# Patient Record
Sex: Female | Born: 1980
Health system: Southern US, Community
[De-identification: ages and names within clinical notes are randomized; demographics above are authoritative.]

## PROBLEM LIST (undated history)

## (undated) DIAGNOSIS — U071 COVID-19: Secondary | ICD-10-CM

## (undated) DIAGNOSIS — N870 Mild cervical dysplasia: Secondary | ICD-10-CM

## (undated) DIAGNOSIS — E119 Type 2 diabetes mellitus without complications: Secondary | ICD-10-CM

## (undated) DIAGNOSIS — R87810 Cervical high risk human papillomavirus (HPV) DNA test positive: Secondary | ICD-10-CM

## (undated) DIAGNOSIS — R5600 Simple febrile convulsions: Secondary | ICD-10-CM

## (undated) DIAGNOSIS — B009 Herpesviral infection, unspecified: Secondary | ICD-10-CM

## (undated) HISTORY — DX: Herpesviral infection, unspecified: B00.9

## (undated) HISTORY — DX: Simple febrile convulsions: R56.00

## (undated) HISTORY — DX: Mild cervical dysplasia: N87.0

## (undated) HISTORY — DX: Type 2 diabetes mellitus without complications: E11.9

## (undated) HISTORY — DX: Cervical high risk human papillomavirus (HPV) DNA test positive: R87.810

---

## 2001-09-08 HISTORY — PX: CHOLECYSTECTOMY: SHX55

## 2018-02-26 ENCOUNTER — Encounter: Payer: 59 | Admitting: Obstetrics and Gynecology

## 2018-06-30 ENCOUNTER — Ambulatory Visit: Payer: Self-pay | Admitting: Physician Assistant

## 2018-07-19 ENCOUNTER — Ambulatory Visit: Payer: 59 | Admitting: Obstetrics and Gynecology

## 2018-07-19 ENCOUNTER — Other Ambulatory Visit: Payer: Self-pay

## 2018-07-19 ENCOUNTER — Encounter: Payer: Self-pay | Admitting: Obstetrics and Gynecology

## 2018-07-19 ENCOUNTER — Other Ambulatory Visit (HOSPITAL_COMMUNITY)
Admission: RE | Admit: 2018-07-19 | Discharge: 2018-07-19 | Disposition: A | Discharge status: Home / Self Care | Payer: 59 | Source: Ambulatory Visit | Attending: Obstetrics and Gynecology | Admitting: Obstetrics and Gynecology

## 2018-07-19 VITALS — BP 130/78 | HR 80 | Resp 16 | Ht 66.5 in | Wt 291.0 lb

## 2018-07-19 DIAGNOSIS — Z113 Encounter for screening for infections with a predominantly sexual mode of transmission: Secondary | ICD-10-CM | POA: Insufficient documentation

## 2018-07-19 DIAGNOSIS — Z01419 Encounter for gynecological examination (general) (routine) without abnormal findings: Secondary | ICD-10-CM | POA: Insufficient documentation

## 2018-07-19 NOTE — Patient Instructions (Signed)
EXERCISE AND DIET:  We recommended that you start or continue a regular exercise program for good health. Regular exercise means any activity that makes your heart beat faster and makes you sweat.  We recommend exercising at least 30 minutes per day at least 3 days a week, preferably 4 or 5.  We also recommend a diet low in fat and sugar.  Inactivity, poor dietary choices and obesity can cause diabetes, heart attack, stroke, and kidney damage, among others.    ALCOHOL AND SMOKING:  Women should limit their alcohol intake to no more than 7 drinks/beers/glasses of wine (combined, not each!) per week. Moderation of alcohol intake to this level decreases your risk of breast cancer and liver damage. And of course, no recreational drugs are part of a healthy lifestyle.  And absolutely no smoking or even second hand smoke. Most people know smoking can cause heart and lung diseases, but did you know it also contributes to weakening of your bones? Aging of your skin?  Yellowing of your teeth and nails?  CALCIUM AND VITAMIN D:  Adequate intake of calcium and Vitamin D are recommended.  The recommendations for exact amounts of these supplements seem to change often, but generally speaking 600 mg of calcium (either carbonate or citrate) and 800 units of Vitamin D per day seems prudent. Certain women may benefit from higher intake of Vitamin D.  If you are among these women, your doctor will have told you during your visit.    PAP SMEARS:  Pap smears, to check for cervical cancer or precancers,  have traditionally been done yearly, although recent scientific advances have shown that most women can have pap smears less often.  However, every woman still should have a physical exam from her gynecologist every year. It will include a breast check, inspection of the vulva and vagina to check for abnormal growths or skin changes, a visual exam of the cervix, and then an exam to evaluate the size and shape of the uterus and  ovaries.  And after 38 years of age, a rectal exam is indicated to check for rectal cancers. We will also provide age appropriate advice regarding health maintenance, like when you should have certain vaccines, screening for sexually transmitted diseases, bone density testing, colonoscopy, mammograms, etc.   MAMMOGRAMS:  All women over 40 years old should have a yearly mammogram. Many facilities now offer a "3D" mammogram, which may cost around $50 extra out of pocket. If possible,  we recommend you accept the option to have the 3D mammogram performed.  It both reduces the number of women who will be called back for extra views which then turn out to be normal, and it is better than the routine mammogram at detecting truly abnormal areas.    COLONOSCOPY:  Colonoscopy to screen for colon cancer is recommended for all women at age 50.  We know, you hate the idea of the prep.  We agree, BUT, having colon cancer and not knowing it is worse!!  Colon cancer so often starts as a polyp that can be seen and removed at colonscopy, which can quite literally save your life!  And if your first colonoscopy is normal and you have no family history of colon cancer, most women don't have to have it again for 10 years.  Once every ten years, you can do something that may end up saving your life, right?  We will be happy to help you get it scheduled when you are ready.    Be sure to check your insurance coverage so you understand how much it will cost.  It may be covered as a preventative service at no cost, but you should check your particular policy.     HPV (Human Papillomavirus) Vaccine: What You Need to Know 1. Why get vaccinated? HPV vaccine prevents infection with human papillomavirus (HPV) types that are associated with many cancers, including:  cervical cancer in females,  vaginal and vulvar cancers in females,  anal cancer in females and males,  throat cancer in females and males, and  penile cancer in  males.  In addition, HPV vaccine prevents infection with HPV types that cause genital warts in both females and males. In the [place] about 12,000 women get cervical cancer every year, and about 4,000 women die from it. HPV vaccine can prevent most of these cases of cervical cancer. Vaccination is not a substitute for cervical cancer screening. This vaccine does not protect against all HPV types that can cause cervical cancer. Women should still get regular Pap tests. HPV infection usually comes from sexual contact, and most people will become infected at some point in their life. About 14 million Americans, including teens, get infected every year. Most infections will go away on their own and not cause serious problems. But thousands of women and men get cancer and other diseases from HPV. 2. HPV vaccine HPV vaccine is approved by FDA and is recommended by CDC for both males and females. It is routinely given at 11 or 38 years of age, but it may be given beginning at age 9 years through age 26 years. Most adolescents 9 through 38 years of age should get HPV vaccine as a two-dose series with the doses separated by 6-12 months. People who start HPV vaccination at 15 years of age and older should get the vaccine as a three-dose series with the second dose given 1-2 months after the first dose and the third dose given 6 months after the first dose. There are several exceptions to these age recommendations. Your health care provider can give you more information. 3. Some people should not get this vaccine  Anyone who has had a severe (life-threatening) allergic reaction to a dose of HPV vaccine should not get another dose.  Anyone who has a severe (life threatening) allergy to any component of HPV vaccine should not get the vaccine.  Tell your doctor if you have any severe allergies that you know of, including a severe allergy to yeast.  HPV vaccine is not recommended for pregnant women. If you learn  that you were pregnant when you were vaccinated, there is no reason to expect any problems for you or your baby. Any woman who learns she was pregnant when she got HPV vaccine is encouraged to contact the manufacturer's registry for HPV vaccination during pregnancy at 1-800-986-8999. Women who are breastfeeding may be vaccinated.  If you have a mild illness, such as a cold, you can probably get the vaccine today. If you are moderately or severely ill, you should probably wait until you recover. Your doctor can advise you. 4. Risks of a vaccine reaction With any medicine, including vaccines, there is a chance of side effects. These are usually mild and go away on their own, but serious reactions are also possible. Most people who get HPV vaccine do not have any serious problems with it. Mild or moderate problems following HPV vaccine:  Reactions in the arm where the shot was given: ? Soreness (about   9 people in 10) ? Redness or swelling (about 1 person in 3)  Fever: ? Mild (100F) (about 1 person in 10) ? Moderate (102F) (about 1 person in 65)  Other problems: ? Headache (about 1 person in 3) Problems that could happen after any injected vaccine:  People sometimes faint after a medical procedure, including vaccination. Sitting or lying down for about 15 minutes can help prevent fainting, and injuries caused by a fall. Tell your doctor if you feel dizzy, or have vision changes or ringing in the ears.  Some people get severe pain in the shoulder and have difficulty moving the arm where a shot was given. This happens very rarely.  Any medication can cause a severe allergic reaction. Such reactions from a vaccine are very rare, estimated at about 1 in a million doses, and would happen within a few minutes to a few hours after the vaccination. As with any medicine, there is a very remote chance of a vaccine causing a serious injury or death. The safety of vaccines is always being monitored. For  more information, visit: www.cdc.gov/vaccinesafety/. 5. What if there is a serious reaction? What should I look for? Look for anything that concerns you, such as signs of a severe allergic reaction, very high fever, or unusual behavior. Signs of a severe allergic reaction can include hives, swelling of the face and throat, difficulty breathing, a fast heartbeat, dizziness, and weakness. These would usually start a few minutes to a few hours after the vaccination. What should I do? If you think it is a severe allergic reaction or other emergency that can't wait, call 9-1-1 or get to the nearest hospital. Otherwise, call your doctor. Afterward, the reaction should be reported to the Vaccine Adverse Event Reporting System (VAERS). Your doctor should file this report, or you can do it yourself through the VAERS web site at www.vaers.hhs.gov, or by calling 1-800-822-7967. VAERS does not give medical advice. 6. The National Vaccine Injury Compensation Program The National Vaccine Injury Compensation Program (VICP) is a federal program that was created to compensate people who may have been injured by certain vaccines. Persons who believe they may have been injured by a vaccine can learn about the program and about filing a claim by calling 1-800-338-2382 or visiting the VICP website at www.hrsa.gov/vaccinecompensation. There is a time limit to file a claim for compensation. 7. How can I learn more?  Ask your health care provider. He or she can give you the vaccine package insert or suggest other sources of information.  Call your local or state health department.  Contact the Centers for Disease Control and Prevention (CDC): ? Call 1-800-232-4636 (1-800-CDC-INFO) or ? Visit CDC's website at www.cdc.gov/hpv Vaccine Information Statement, HPV Vaccine (08/10/2015) This information is not intended to replace advice given to you by your health care provider. Make sure you discuss any questions you have  with your health care provider. Document Released: 03/22/2014 Document Revised: 05/15/2016 Document Reviewed: 05/15/2016 Elsevier Interactive Patient Education  2017 Elsevier Inc.  

## 2018-07-19 NOTE — Progress Notes (Signed)
37 y.o. [id number] Single African American female here for annual exam.    Taking continuous OCPs for her birth control.  Likes her current pill.  Breakthrough bleeding with Ortho Tricyclen. On OCPs since her 89s.   Denies tobacco use, HTN, Migraine with aura, liver or breast disease, DVT/PE or family history of this.   Hx BV.   Moisture with wiping.  No leak unless laughs very hard.  No vaginal discharge.   Bleeding with recent intercourse.  Same partner for 4 months.  This is the first time in a while.  Last occurred with more aggressive intercourse.   Works for [employer].  From [place].   PCP:  None  Patient's last menstrual period was 07/06/2018 (exact date).     Period Cycle (Days): 90 Period Duration (Days): 5 days Period Pattern: Regular Menstrual Flow: Moderate Menstrual Control: Tampon, Maxi pad Menstrual Control Change Freq (Hours): every 3-4 hours on heaviest day Dysmenorrhea: (!) Mild Dysmenorrhea Symptoms: Cramping     Sexually active: Yes.   female The current method of family planning is OCP (estrogen/progesterone).    Exercising: No.   Smoker:  no  Health Maintenance: Pap: 03/2017 normal per patient History of abnormal Pap:  Yes, hx of 2 colposcopies but no treatment to cervix--last colpo 2017 MMG:  n/a Colonoscopy:  n/a BMD:   n/a  Result  n/a TDaP: 2017 Gardasil:   no HIV: during pregnancy Hep C:during pregnancy Screening Labs:   ---  PCP.    reports that she has never smoked. She has never used smokeless tobacco. She reports that she drinks about 1.0 standard drinks of alcohol per week. She reports that she does not use drugs.  Past Medical History:  Diagnosis Date  . Febrile seizures (HCC)    as a child--pt.states epileptic seizures    Past Surgical History:  Procedure Laterality Date  . CHOLECYSTECTOMY  2003    Current Outpatient Medications  Medication Sig Dispense Refill  . norgestrel-ethinyl estradiol (CRYSELLE-28) 0.3-30  MG-MCG tablet Take 1 tablet by mouth daily.    . naproxen (NAPROSYN) 500 MG tablet Take 500 mg by mouth 2 (two) times daily as needed.  0   No current facility-administered medications for this visit.     Family History  Problem Relation Age of Onset  . Diabetes Mother   . Thyroid disease Mother        hypothyroid  . Hypertension Father   . Diabetes Sister   . Diabetes Maternal Grandmother   . Stroke Maternal Grandmother   . Hypertension Paternal Grandmother   . Stroke Paternal Grandmother     Review of Systems  All other systems reviewed and are negative.   Exam:   BP 130/78 (BP Location: Right Arm, Patient Position: Sitting, Cuff Size: Large)   Pulse 80   Resp 16   Ht 5' 6.5" (1.689 m)   Wt 291 lb (132 kg)   LMP 07/06/2018 (Exact Date)   BMI 46.26 kg/m     General appearance: alert, cooperative and appears stated age Head: Normocephalic, without obvious abnormality, atraumatic Neck: no adenopathy, supple, symmetrical, trachea midline and thyroid normal to inspection and palpation Lungs: clear to auscultation bilaterally Breasts: normal appearance, no masses or tenderness, No nipple retraction or dimpling, No nipple discharge or bleeding, No axillary or supraclavicular adenopathy Heart: regular rate and rhythm Abdomen: soft, non-tender; no masses, no organomegaly Extremities: extremities normal, atraumatic, no cyanosis or edema Skin: Skin color, texture, turgor normal. No rashes or  lesions Lymph nodes: Cervical, supraclavicular, and axillary nodes normal. No abnormal inguinal nodes palpated Neurologic: Grossly normal  Pelvic: External genitalia:  no lesions              Urethra:  normal appearing urethra with no masses, tenderness or lesions              Bartholins and Skenes: normal                 Vagina: normal appearing vagina with normal color and discharge, no lesions              Cervix: no lesions              Pap taken: Yes.   Bimanual Exam:  Uterus:   normal size, contour, position, consistency, mobility, non-tender              Adnexa: no mass, fullness, tenderness         Chaperone was present for exam.  Assessment:   Well woman visit with normal exam. Hx abnormal pap.  Post coital bleeding recently.   Plan: Mammogram screening. Recommended self breast awareness. Pap and HR HPV as above. Guidelines for Calcium, Vitamin D, regular exercise program including cardiovascular and weight bearing exercise. We talked about her joining the [facility].  She will contact me for OCP refills when she needs them.  She declines refills today.  Gardasil discussed.  Flu vaccine discussed.  STD screening.  Will get old pap records.  She will establish care with a PCP and do labs there.  Follow up annually and prn.    After visit summary provided.

## 2018-07-20 LAB — HEP, RPR, HIV PANEL
HEP B S AG: NEGATIVE
HIV Screen 4th Generation wRfx: NONREACTIVE
RPR Ser Ql: NONREACTIVE

## 2018-07-20 LAB — HEPATITIS C ANTIBODY

## 2018-07-21 LAB — CYTOLOGY - PAP
CHLAMYDIA, DNA PROBE: NEGATIVE
DIAGNOSIS: NEGATIVE
HPV: NOT DETECTED
Neisseria Gonorrhea: NEGATIVE
TRICH (WINDOWPATH): NEGATIVE

## 2018-11-15 DIAGNOSIS — J0110 Acute frontal sinusitis, unspecified: Secondary | ICD-10-CM | POA: Diagnosis not present

## 2018-12-15 ENCOUNTER — Ambulatory Visit: Payer: Self-pay

## 2018-12-15 ENCOUNTER — Ambulatory Visit (INDEPENDENT_AMBULATORY_CARE_PROVIDER_SITE_OTHER): Payer: BLUE CROSS/BLUE SHIELD | Admitting: Sports Medicine

## 2018-12-15 ENCOUNTER — Other Ambulatory Visit: Payer: Self-pay

## 2018-12-15 ENCOUNTER — Encounter: Payer: Self-pay | Admitting: Sports Medicine

## 2018-12-15 VITALS — BP 120/84 | HR 81 | Temp 98.4°F | Ht 66.5 in | Wt 310.8 lb

## 2018-12-15 DIAGNOSIS — G8929 Other chronic pain: Secondary | ICD-10-CM | POA: Diagnosis not present

## 2018-12-15 DIAGNOSIS — M25561 Pain in right knee: Secondary | ICD-10-CM | POA: Diagnosis not present

## 2018-12-15 DIAGNOSIS — Z6841 Body Mass Index (BMI) 40.0 and over, adult: Secondary | ICD-10-CM

## 2018-12-15 DIAGNOSIS — M25461 Effusion, right knee: Secondary | ICD-10-CM | POA: Diagnosis not present

## 2018-12-15 NOTE — Progress Notes (Signed)
Veverly Fells. [facility] at [facility] [phone]  Kathya Nemer - 38 y.o. female MRN [id number]  Date of birth: April 09, 1981  Visit Date: December 18, 2018  PCP: Patient, No Pcp Per   Referred by: No ref. provider found  SUBJECTIVE:   Chief Complaint  Patient presents with  . New Patient (Initial Visit)    R Knee pain    HPI: Right knee pain since July 2019.  + popping and clicking Seen at [facility] and told X-rays were normal. Injection gave good results however symptoms returned several months ago. Has been working on weight loss but is   REVIEW OF SYSTEMS: Night time disturbances: Reports, pain with laying on her side due to direct pressure on medial knee Fevers, chills and night sweats: Denies Unexplained weight loss: Denies Personal history of cancer: Denies Changes in bowel or bladder habits: Denies Recent unreported falls: Denies New or worsening dyspnea or wheezing: Denies Headaches and dizziness: Denies Numbness, tingling and weakness in the extremities: Denies Dizziness or presyncopal episodes: Denies Lower extremity edema: Denies  HISTORY:  Prior history reviewed and updated per electronic medical record.  There are no active problems to display for this patient.  Social History   Occupational History  . Not on file  Tobacco Use  . Smoking status: Never Smoker  . Smokeless tobacco: Never Used  Substance and Sexual Activity  . Alcohol use: Yes    Alcohol/week: 1.0 standard drinks    Types: 1 Glasses of wine per week    Frequency: Never  . Drug use: Never  . Sexual activity: Yes    Birth control/protection: Pill    Comment: Cryselle   Social History   Social History Narrative  . Not on file   Past Medical History:  Diagnosis Date  . Cervical high risk HPV (human papillomavirus) test positive 2014, 2015   Colposcopy 2016 - negative.  . CIN I (cervical intraepithelial neoplasia I) 2006, 2008, 2010    colposcopic biopsies - [place]  . Febrile seizures (HCC)    as a child--pt.states epileptic seizures   Past Surgical History:  Procedure Laterality Date  . CHOLECYSTECTOMY  2003   family history includes Diabetes in her maternal grandmother, mother, and sister; Hypertension in her father and paternal grandmother; Stroke in her maternal grandmother and paternal grandmother; Thyroid disease in her mother.  OBJECTIVE:  VS:  HT:5' 6.5" (168.9 cm)   WT:(!) 310 lb 12.8 oz (141 kg)  BMI:49.42    BP:120/84  HR:81bpm  TEMP:98.4 F (36.9 C)( )  RESP:98 %   PHYSICAL EXAM: CONSTITUTIONAL: Well-developed, Well-nourished and In no acute distress EYES: Pupils are equal., EOM intact without nystagmus. and No scleral icterus. Psychiatric: Alert & appropriately interactive. and Not depressed or anxious appearing. EXTREMITY EXAM: Warm and well perfused  Right Knee: Large soft tissue envelope Small effusion with moderate synovitis Ligamentously stable Pain with medial and lateral joint line palpation Negative McMurray's Symmetric 1+ LE edema   ASSESSMENT:   1. Chronic pain of right knee     PROCEDURES:  US Guided Injection per procedure note   PROCEDURE NOTE: THERAPEUTIC EXERCISES (97110) 15 minutes spent for Therapeutic exercises as below and as referenced in the AVS.  This included exercises focusing on stretching, strengthening, with significant focus on eccentric aspects.   Proper technique shown and discussed handout in great detail with ATC.  All questions were discussed and answered.   Long term goals include  an improvement in range of motion, strength, endurance as well as avoiding reinjury. Frequency of visits is one time as determined during today's  office visit. Frequency of exercises to be performed is as per handout.  EXERCISES REVIEWED: Hip ABduction strengthening with focus on Glute Medius Recruitment VMO Strengthening      PLAN:  Pertinent additional  documentation may be included in corresponding procedure notes, imaging studies, problem based documentation and patient instructions.  No problem-specific Assessment & Plan notes found for this encounter.   We will go ahead and inject the knee per procedure note and have them begin on hip and knee strenghtening exersises. Additionally we discussed the merits of compression and/or bracing and recommend prophylatic compression with activity.  Icing discussed PRN.  If persistent ongoing symptoms can consider repeat injections and viscous supplementation.  Long discussion today about the merits, struggles and physiology of weight loss.  Ultimately formal consultation with wt loss specialist recommended.  Dr. Philis PiqueWallace's information provided today for PCP  Home Therapeutic Exercises: New program prescribed today per procedure note.   Activity modifications and the importance of avoiding exacerbating activities (limiting pain to no more than a 4 / 10 during or following activity) recommended and discussed.   Discussed red flag symptoms that warrant earlier emergent evaluation and patient voices understanding.    No orders of the defined types were placed in this encounter.  Lab Orders  No laboratory test(s) ordered today   Imaging Orders     US MSK POCT ULTRASOUND Referral Orders  No referral(s) requested today    Return if symptoms worsen or fail to improve.          Andrena MewsMichael D , DO    [facility]

## 2018-12-15 NOTE — Patient Instructions (Addendum)

## 2018-12-18 ENCOUNTER — Encounter: Payer: Self-pay | Admitting: Sports Medicine

## 2018-12-18 DIAGNOSIS — M25561 Pain in right knee: Principal | ICD-10-CM

## 2018-12-18 DIAGNOSIS — G8929 Other chronic pain: Secondary | ICD-10-CM | POA: Insufficient documentation

## 2018-12-18 NOTE — Procedures (Signed)
PROCEDURE NOTE - ULTRASOUND GUIDED INJECTION: LEFT Knee Images were obtained and interpreted by myself, Gaspar Bidding, DO  Images have been saved and stored to PACS system. Images obtained on: GE S7 Ultrasound machine  ULTRASOUND FINDINGS:  Moderate effusion Moderate synovitis No significant spurring Degenerative changes of medial and lateral meniscus  DESCRIPTION OF PROCEDURE:  The patient's clinical condition is marked by substantial pain and/or significant functional disability. Other conservative therapy has not provided relief, is contraindicated, or not appropriate. There is a reasonable likelihood that injection will significantly improve the patient's pain and/or functional impairment. After discussing the risks, benefits and expected outcomes of the injection and all questions were reviewed and answered, the patient wished to undergo the above named procedure. Verbal consent was obtained. The ultrasound was used to identify the target structure and adjacent neurovascular structures. The skin was then prepped in sterile fashion and the target structure was injected under direct visualization using sterile technique as below: PREP: Alcohol, Ethel Chloride APPROACH: Superiolateral, single injection, 25g 1.5" needle INJECTATE: 2cc 1% lidocaine, 2 cc 40mg  DepoMedrol ASPIRATE: N/A DRESSING: Band-Aid   Post procedural instructions including recommending icing and warning signs for infection were reviewed. This procedure was well tolerated and there were no complications.   IMPRESSION: Succesful US Guided Injection

## 2018-12-21 ENCOUNTER — Ambulatory Visit: Payer: BLUE CROSS/BLUE SHIELD | Admitting: Family Medicine

## 2018-12-21 NOTE — Progress Notes (Deleted)
   Virtual Visit via Video   I connected with Lauren Murphy on 12/21/18 at  1:20 PM EDT by a video enabled telemedicine application and verified that I am speaking with the correct person using two identifiers. Location patient: Home Location provider: [facility], Office Persons participating in the virtual visit: Khaniya, Kust, DO   I discussed the limitations of evaluation and management by telemedicine and the availability of in person appointments. The patient expressed understanding and agreed to proceed.  Subjective:   HPI: ***  ROS: See pertinent positives and negatives per HPI.  Patient Active Problem List   Diagnosis Date Noted  . Chronic pain of right knee 12/18/2018    Social History   Tobacco Use  . Smoking status: Never Smoker  . Smokeless tobacco: Never Used  Substance Use Topics  . Alcohol use: Yes    Alcohol/week: 1.0 standard drinks    Types: 1 Glasses of wine per week    Frequency: Never    Current Outpatient Medications:  .  naproxen (NAPROSYN) 500 MG tablet, Take 440 mg by mouth 2 (two) times daily as needed. , Disp: , Rfl: 0 .  norgestrel-ethinyl estradiol (CRYSELLE-28) 0.3-30 MG-MCG tablet, Take 1 tablet by mouth daily., Disp: , Rfl:   No Known Allergies  Objective:   VITALS: Per patient if applicable, see vitals. GENERAL: Alert, appears well and in no acute distress. HEENT: Atraumatic, conjunctiva clear, no obvious abnormalities on inspection of external nose and ears. NECK: Normal movements of the head and neck. CARDIOPULMONARY: No increased WOB. Speaking in clear sentences. I:E ratio WNL.  MS: Moves all visible extremities without noticeable abnormality. PSYCH: Pleasant and cooperative, well-groomed. Speech normal rate and rhythm. Affect is appropriate. Insight and judgement are appropriate. Attention is focused, linear, and appropriate.  NEURO: CN grossly intact. Oriented as arrived to appointment on time with no prompting. Moves  both UE equally.  SKIN: No obvious lesions, wounds, erythema, or cyanosis noted on face or hands.  Assessment and Plan:   There are no diagnoses linked to this encounter.  . Reviewed expectations re: course of current medical issues. . Discussed self-management of symptoms. . Outlined signs and symptoms indicating need for more acute intervention. . Patient verbalized understanding and all questions were answered. Marland Kitchen Health Maintenance issues including appropriate healthy diet, exercise, and smoking avoidance were discussed with patient. . See orders for this visit as documented in the electronic medical record.  Helane Rima, DO 12/21/2018

## 2019-02-06 NOTE — Progress Notes (Signed)
Lauren Murphy is a 38 y.o. female here for a new problem.  History of Present Illness:   Chief Complaint  Patient presents with  . Establish Care  . lump under chin    HPI  Mass under chin Patient reports that she noticed a mass under her chin on Thursday of last week.  She was washing her face and felt a area of fullness under her chin.  She states that it felt like at one point it was a pimple coming up.  Denies any tenderness to the area, hardness, fever or discharge.  She denies any issues swallowing, but does state that she feels like she has some excessive throat clearing at times.  Denies any changes in her voice.  She is a non-smoker.  Mother with hypothyroidism.  Obesity Patient tearfully discussed this today.  She admits that she is struggling with her weight and it affects her on a daily basis.  She is used phentermine in the past.  When she was doing her best, she was down to 180 pounds, but in order to maintain that weight, she felt like she had to work out excessively and eat really well, and she did not feel like this was sustainable for her.  She has considered weight loss surgery, but she feels like this would be considered a failure.  She thinks that she had gestational diabetes with her second child (who is currently 67 y/o.)  She did pass her glucose tolerance test, however her daughter was over 10 pounds when born.  She has a strong family history of diabetes.  Diarrhea Ever since she had her gallbladder out in 2003 she has had significant diarrhea.  She is at the point where she has diarrhea on a daily basis.  She has never seen GI before for this.  She states that she thinks that she has IBS D.  She has to plan her diet accordingly when she is to travel or to be away from the bathroom.  She denies any blood in her diarrhea.  She denies any family history of any GI cancers or Crohn's or colitis.  She also gets abdominal pain.  She has tried probiotics along time ago but did not  find them to be helpful.  Insomnia Over the past 2 to 3 months she has had significant issues with sleeping.  She states that she has had troubles falling asleep and staying asleep.  She denies any caffeine products presently.  She is not on any supplements with stimulants.  She states that she does have a history of insomnia and was even on Ambien at one point.  Past Medical History:  Diagnosis Date  . Cervical high risk HPV (human papillomavirus) test positive 2014, 2015   Colposcopy 2016 - negative.  . CIN I (cervical intraepithelial neoplasia I) 2006, 2008, 2010   colposcopic biopsies - [place]  . Febrile seizures (HCC)    as a child--pt.states epileptic seizures     Social History   Socioeconomic History  . Marital status: Single    Spouse name: Not on file  . Number of children: Not on file  . Years of education: Not on file  . Highest education level: Not on file  Occupational History  . Not on file  Social Needs  . Financial resource strain: Not on file  . Food insecurity:    Worry: Not on file    Inability: Not on file  . Transportation needs:    Medical:  Not on file    Non-medical: Not on file  Tobacco Use  . Smoking status: Never Smoker  . Smokeless tobacco: Never Used  Substance and Sexual Activity  . Alcohol use: Yes    Alcohol/week: 1.0 standard drinks    Types: 1 Glasses of wine per week    Frequency: Never  . Drug use: Never  . Sexual activity: Yes    Birth control/protection: Pill    Comment: Cryselle  Lifestyle  . Physical activity:    Days per week: 1 day    Minutes per session: 30 min  . Stress: Not on file  Relationships  . Social connections:    Talks on phone: Not on file    Gets together: Not on file    Attends religious service: Not on file    Active member of club or organization: Not on file    Attends meetings of clubs or organizations: Not on file    Relationship status: Not on file  . Intimate partner violence:    Fear of  current or ex partner: Not on file    Emotionally abused: Not on file    Physically abused: Not on file    Forced sexual activity: Not on file  Other Topics Concern  . Not on file  Social History Narrative  . Not on file    Past Surgical History:  Procedure Laterality Date  . CHOLECYSTECTOMY  2003    Family History  Problem Relation Age of Onset  . Diabetes Mother   . Thyroid disease Mother        hypothyroid  . Hypertension Father   . Diabetes Sister   . Diabetes Maternal Grandmother   . Stroke Maternal Grandmother   . Hypertension Paternal Grandmother   . Stroke Paternal Grandmother   . Diabetes Paternal Grandfather   . Glaucoma Paternal Grandfather   . Diabetes Sister     No Known Allergies  Current Medications:   Current Outpatient Medications:  .  norgestrel-ethinyl estradiol (CRYSELLE-28) 0.3-30 MG-MCG tablet, Take 1 tablet by mouth daily., Disp: , Rfl:  .  traZODone (DESYREL) 50 MG tablet, Take 0.5-1 tablets (25-50 mg total) by mouth at bedtime as needed for sleep., Disp: 30 tablet, Rfl: 3   Review of Systems:   Review of Systems  Constitutional: Negative for chills, fever, malaise/fatigue and weight loss.  Respiratory: Negative for shortness of breath.   Cardiovascular: Negative for chest pain, orthopnea, claudication and leg swelling.  Gastrointestinal: Positive for abdominal pain and diarrhea. Negative for heartburn, nausea and vomiting.  Neurological: Negative for dizziness, tingling and headaches.  Psychiatric/Behavioral: The patient has insomnia.     Vitals:   Vitals:   02/07/19 0914  BP: 121/61  Pulse: 86  Temp: 98.5 F (36.9 C)  SpO2: 98%  Weight: (!) 310 lb (140.6 kg)  Height: 5\' 6"  (1.676 m)     Body mass index is 50.04 kg/m.  Physical Exam:   Physical Exam Vitals signs and nursing note reviewed.  Constitutional:      General: She is not in acute distress.    Appearance: She is well-developed. She is not ill-appearing or  toxic-appearing.  Cardiovascular:     Rate and Rhythm: Normal rate and regular rhythm.     Pulses: Normal pulses.     Heart sounds: Normal heart sounds, S1 normal and S2 normal.     Comments: No LE edema Pulmonary:     Effort: Pulmonary effort is normal.  Breath sounds: Normal breath sounds.  Skin:    General: Skin is warm and dry.     Comments: Area of localized tissue swelling under chin, no tenderness with palpation, no erythema or discharge present  Neurological:     Mental Status: She is alert.     GCS: GCS eye subscore is 4. GCS verbal subscore is 5. GCS motor subscore is 6.  Psychiatric:        Attention and Perception: Attention normal.        Mood and Affect: Mood normal. Affect is tearful.        Speech: Speech normal.        Behavior: Behavior normal. Behavior is cooperative.      Assessment and Plan:   Sarh was seen today for establish care and lump under chin.  Diagnoses and all orders for this visit:  Neck mass Unclear etiology. Will obtain ultrasound and labs to further work-up. -     CBC with Differential/Platelet -     Comprehensive metabolic panel -     TSH -     US Soft Tissue Head/Neck; Future  Diarrhea, unspecified type Referral to GI.  I did encourage her to try some fiber to help bulk up her stools, and consider resuming probiotics. -     Ambulatory referral to Gastroenterology  Obesity, unspecified classification, unspecified obesity type, unspecified whether serious comorbidity present I am going to have her see Dr. Helane Rima for further evaluation and treatment.  Insomnia, unspecified type Start trazodone, and follow-up if no improvement.  Other orders -     traZODone (DESYREL) 50 MG tablet; Take 0.5-1 tablets (25-50 mg total) by mouth at bedtime as needed for sleep.   . Reviewed expectations re: course of current medical issues. . Discussed self-management of symptoms. . Outlined signs and symptoms indicating need for more acute  intervention. . Patient verbalized understanding and all questions were answered. . See orders for this visit as documented in the electronic medical record. . Patient received an After-Visit Summary.    Jarold Motto, PA-C

## 2019-02-07 ENCOUNTER — Other Ambulatory Visit: Payer: Self-pay

## 2019-02-07 ENCOUNTER — Encounter: Payer: Self-pay | Admitting: Physician Assistant

## 2019-02-07 ENCOUNTER — Ambulatory Visit (INDEPENDENT_AMBULATORY_CARE_PROVIDER_SITE_OTHER): Payer: BLUE CROSS/BLUE SHIELD | Admitting: Physician Assistant

## 2019-02-07 VITALS — BP 121/61 | HR 86 | Temp 98.5°F | Ht 66.0 in | Wt 310.0 lb

## 2019-02-07 DIAGNOSIS — E669 Obesity, unspecified: Secondary | ICD-10-CM | POA: Diagnosis not present

## 2019-02-07 DIAGNOSIS — R7309 Other abnormal glucose: Secondary | ICD-10-CM

## 2019-02-07 DIAGNOSIS — R221 Localized swelling, mass and lump, neck: Secondary | ICD-10-CM | POA: Diagnosis not present

## 2019-02-07 DIAGNOSIS — G4700 Insomnia, unspecified: Secondary | ICD-10-CM | POA: Diagnosis not present

## 2019-02-07 DIAGNOSIS — R197 Diarrhea, unspecified: Secondary | ICD-10-CM

## 2019-02-07 LAB — COMPREHENSIVE METABOLIC PANEL
ALT: 17 U/L (ref 0–35)
AST: 18 U/L (ref 0–37)
Albumin: 3.7 g/dL (ref 3.5–5.2)
Alkaline Phosphatase: 40 U/L (ref 39–117)
BUN: 11 mg/dL (ref 6–23)
CO2: 26 mEq/L (ref 19–32)
Calcium: 8.9 mg/dL (ref 8.4–10.5)
Chloride: 102 mEq/L (ref 96–112)
Creatinine, Ser: 0.69 mg/dL (ref 0.40–1.20)
GFR: 114.58 mL/min (ref >60.00)
Glucose, Bld: 121 mg/dL — ABNORMAL HIGH (ref 70–99)
Potassium: 3.6 mEq/L (ref 3.5–5.1)
Sodium: 137 mEq/L (ref 135–145)
Total Bilirubin: 0.3 mg/dL (ref 0.2–1.2)
Total Protein: 7.2 g/dL (ref 6.0–8.3)

## 2019-02-07 LAB — CBC WITH DIFFERENTIAL/PLATELET
Basophils Absolute: 0.1 10*3/uL (ref 0.0–0.1)
Basophils Relative: 1 % (ref 0.0–3.0)
Eosinophils Absolute: 0.3 10*3/uL (ref 0.0–0.7)
Eosinophils Relative: 4.1 % (ref 0.0–5.0)
HCT: 37.5 % (ref 36.0–46.0)
Hemoglobin: 12.6 g/dL (ref 12.0–15.0)
Lymphocytes Relative: 31.7 % (ref 12.0–46.0)
Lymphs Abs: 2.4 10*3/uL (ref 0.7–4.0)
MCHC: 33.5 g/dL (ref 30.0–36.0)
MCV: 90.9 fl (ref 78.0–100.0)
Monocytes Absolute: 0.3 10*3/uL (ref 0.1–1.0)
Monocytes Relative: 4 % (ref 3.0–12.0)
Neutro Abs: 4.4 10*3/uL (ref 1.4–7.7)
Neutrophils Relative %: 59.2 % (ref 43.0–77.0)
Platelets: 269 10*3/uL (ref 150.0–400.0)
RBC: 4.12 Mil/uL (ref 3.87–5.11)
RDW: 13.3 % (ref 11.5–15.5)
WBC: 7.5 10*3/uL (ref 4.0–10.5)

## 2019-02-07 LAB — TSH: TSH: 1.76 u[IU]/mL (ref 0.35–4.50)

## 2019-02-07 MED ORDER — TRAZODONE HCL 50 MG PO TABS: 25.0000 mg | ORAL_TABLET | Freq: Every evening | ORAL | 3 refills | Status: DC | PRN

## 2019-02-07 NOTE — Patient Instructions (Signed)
It was great to see you! Welcome to the [facility] team :)  You will be contacted about scheduling your ultrasound and your referral to GI.  We will contact you with your lab results.  Start the 25 mg Trazodone and see if this helps with sleep. You may take 50 mg if you find it ineffective.  Please follow-up with Dr. Earlene Plater about weight loss.   Take care,  Jarold Motto PA-C

## 2019-02-08 LAB — HEMOGLOBIN A1C: Hgb A1c MFr Bld: 7.6 % — ABNORMAL HIGH (ref 4.6–6.5)

## 2019-02-08 NOTE — Addendum Note (Signed)
Addended by: Haynes Bast on: 02/08/2019 08:38 AM   Modules accepted: Orders

## 2019-02-08 NOTE — Addendum Note (Signed)
Addended by: Charna Elizabeth on: 02/08/2019 02:02 PM   Modules accepted: Orders

## 2019-02-09 ENCOUNTER — Encounter: Payer: Self-pay | Admitting: Physician Assistant

## 2019-02-09 DIAGNOSIS — E119 Type 2 diabetes mellitus without complications: Secondary | ICD-10-CM | POA: Insufficient documentation

## 2019-02-16 ENCOUNTER — Ambulatory Visit: Payer: BLUE CROSS/BLUE SHIELD | Admitting: Family Medicine

## 2019-02-18 ENCOUNTER — Ambulatory Visit: Payer: BLUE CROSS/BLUE SHIELD | Admitting: Gastroenterology

## 2019-02-23 ENCOUNTER — Ambulatory Visit (INDEPENDENT_AMBULATORY_CARE_PROVIDER_SITE_OTHER): Payer: BC Managed Care – PPO | Admitting: Family Medicine

## 2019-02-23 ENCOUNTER — Other Ambulatory Visit: Payer: Self-pay

## 2019-02-23 ENCOUNTER — Encounter: Payer: Self-pay | Admitting: Family Medicine

## 2019-02-23 VITALS — BP 126/82 | HR 91 | Temp 99.1°F | Ht 66.75 in | Wt 305.8 lb

## 2019-02-23 DIAGNOSIS — Z6841 Body Mass Index (BMI) 40.0 and over, adult: Secondary | ICD-10-CM

## 2019-02-23 DIAGNOSIS — E119 Type 2 diabetes mellitus without complications: Secondary | ICD-10-CM | POA: Diagnosis not present

## 2019-02-23 MED ORDER — ONDANSETRON HCL 4 MG PO TABS: 4.0000 mg | ORAL_TABLET | Freq: Three times a day (TID) | ORAL | 0 refills | Status: DC | PRN

## 2019-02-23 NOTE — Progress Notes (Signed)
Lauren Murphy is a 38 y.o. female is here for follow up of obesity management.   Subjective   History of Present Illness: Recent new patient appointment with Lauren MottoSamantha Murphy. A1c was 7.6 and Trulicity was recommended. She has not started this yet, worried that it will cause GI upset in setting of her IBS. Working on weight loss. Down 5 pounds. Struggling with night-time cravings.  Weight Change: Wt Readings from Last 3 Encounters:  03/01/19 (!) 305 lb (138.3 kg)  02/23/19 (!) 305 lb 12.8 oz (138.7 kg)  02/07/19 (!) 310 lb (140.6 kg)   Appetite:  Medication(s): Phentermine taken about 9 years ago. It did help a lot. She restarted it about three years ago did not help much.    Appetite well controlled? No. How hungry? (not hungry) 1-10 (starving) Answer: 4   Sleep Quality:  She reports sleeping fairly well and is getting 6 hours of sleep a night.   Stress:  The patient indicates a stress level of 6 (1: low stress, 10: high stress) caused by Other: family stress, COVID, not feeling strong enough to lose weight without help.  No flowsheet data found.   [facility] 02/07/2019  Decreased Interest 0  Down, Depressed, Hopeless 0  PHQ - 2 Score 0  Altered sleeping 3  Tired, decreased energy 0  Change in appetite 2  Feeling bad or failure about yourself  0  Trouble concentrating 0  Moving slowly or fidgety/restless 0  Suicidal thoughts 0  PHQ-9 Score 5  Difficult doing work/chores Somewhat difficult   Barriers: Lack of confidence  PMHx, SurgHx, SocialHx, FamHx, Medications, and Allergies were reviewed in the Visit Navigator and updated as appropriate.   Patient Active Problem List   Diagnosis Date Noted  . Diabetes mellitus without complication (HCC) 02/09/2019  . Chronic pain of right knee 12/18/2018   Social History   Tobacco Use  . Smoking status: Never Smoker  . Smokeless tobacco: Never Used  Substance Use Topics  . Alcohol use: Yes    Alcohol/week: 1.0  standard drinks    Types: 1 Glasses of wine per week    Frequency: Never  . Drug use: Never   Current Medications and Allergies:   Current Outpatient Medications:  .  norgestrel-ethinyl estradiol (CRYSELLE-28) 0.3-30 MG-MCG tablet, Take 1 tablet by mouth daily., Disp: , Rfl:  .  traZODone (DESYREL) 50 MG tablet, Take 0.5-1 tablets (25-50 mg total) by mouth at bedtime as needed for sleep., Disp: 30 tablet, Rfl: 3 .  AMBULATORY NON FORMULARY MEDICATION, Medication Name: [employer] supplement, Disp: , Rfl:  .  ondansetron (ZOFRAN) 4 MG tablet, Take 1 tablet (4 mg total) by mouth every 8 (eight) hours as needed for nausea or vomiting., Disp: 20 tablet, Rfl: 0  No Known Allergies   Review of Systems   Pertinent items are noted in the HPI. Otherwise, ROS is negative.  Vitals:   Vitals:   02/23/19 1154  BP: 126/82  Pulse: 91  Temp: 99.1 F (37.3 C)  TempSrc: Oral  SpO2: 96%  Weight: (!) 305 lb 12.8 oz (138.7 kg)  Height: 5' 6.75" (1.695 m)     Body mass index is 48.25 kg/m.  Physical Exam:   Physical Exam Vitals signs and nursing note reviewed.  HENT:     Head: Normocephalic and atraumatic.  Eyes:     Pupils: Pupils are equal, round, and reactive to light.  Neck:     Musculoskeletal: Normal range of motion  and neck supple.  Cardiovascular:     Rate and Rhythm: Normal rate and regular rhythm.     Heart sounds: Normal heart sounds.  Pulmonary:     Effort: Pulmonary effort is normal.  Abdominal:     Palpations: Abdomen is soft.  Skin:    General: Skin is warm.  Psychiatric:        Behavior: Behavior normal.    CBC Latest Ref Rng & Units 02/07/2019  WBC 4.0 - 10.5 K/uL 7.5  Hemoglobin 12.0 - 15.0 g/dL 12.6  Hematocrit 36.0 - 46.0 % 37.5  Platelets 150.0 - 400.0 K/uL 269.0   Lab Results  Component Value Date   ALT 17 02/07/2019   AST 18 02/07/2019   ALKPHOS 40 02/07/2019   BILITOT 0.3 02/07/2019   Lab Results  Component Value Date   CREATININE 0.69 02/07/2019     BUN 11 02/07/2019   NA 137 02/07/2019   K 3.6 02/07/2019   CL 102 02/07/2019   CO2 26 02/07/2019   Lab Results  Component Value Date   HGBA1C 7.6 (H) 02/08/2019   No results found for: INSULIN  Lab Results  Component Value Date   TSH 1.76 02/07/2019   No results found for: CHOL, HDL, LDLCALC, LDLDIRECT, TRIG, CHOLHDL No results found for: IRON, TIBC, FERRITIN  Assessment and Plan:   Lauren Murphy was seen today for follow-up.  Diagnoses and all orders for this visit:  Diabetes mellitus without complication ([name]) Comments: Discussed medication and benefits of weight loss at length. Will start medication. Follow up with PCP in 2-4 weeks. Orders: -     ondansetron (ZOFRAN) 4 MG tablet; Take 1 tablet (4 mg total) by mouth every 8 (eight) hours as needed for nausea or vomiting.  Class 3 severe obesity due to excess calories without serious comorbidity with body mass index (BMI) of 45.0 to 49.9 in adult [facility])   FITTE:   [x]   Cardio [x]   Resistance exercises  []   ACSM recommendations (150 minutes/week in active weight loss)  Behavior:   [x]   Motivational interviewing performed []   Referral for counseling [x]   Discussed strategies to overcome habits/challenges for focus  Medications:  See orders as documented in the EMR.  Education:  [x]  Lifestyle/Behavioral Modification  [x]  Balance and Variety  []  Appropriate Portions  []  Label Reading  []  Caloric Needs [x]  Physical Activity   [x]  Goal Setting  []  Dining Out  Reviewed: [x]   Nutrition and the importance of regular protein intake. [x]   Hidden CHO/carbohydrate sources. [x]   Alcohol as possible source of hidden/amnesia calories and its effects. [x]   Importance of physical activity and reducing sedentary time. [x]   Reviewed expectations re: course of current medical issues. [x]   Outlined signs and symptoms indicating need for more acute intervention. [x]   See orders for this visit as documented in the electronic medical  record.  Briscoe Deutscher, DO [place], [facility] 03/01/2019  Records requested if needed. Time spent: 25 minutes, of which >50% was spent in obtaining information about her symptoms, reviewing her previous labs, evaluations, and treatments, counseling her about her condition (please see the discussed topics above), and developing a plan to further investigate it; she had a number of questions which I addressed.

## 2019-02-23 NOTE — Patient Instructions (Signed)
Health Maintenance Due  Topic Date Due  . PNEUMOCOCCAL POLYSACCHARIDE VACCINE AGE 39-64 HIGH RISK  01/18/1982  . FOOT EXAM  01/18/1990  . OPHTHALMOLOGY EXAM  01/18/1990  . URINE MICROALBUMIN  01/18/1990    Depression screen PHQ 2/9 02/07/2019  Decreased Interest 0  Down, Depressed, Hopeless 0  PHQ - 2 Score 0  Altered sleeping 3  Tired, decreased energy 0  Change in appetite 2  Feeling bad or failure about yourself  0  Trouble concentrating 0  Moving slowly or fidgety/restless 0  Suicidal thoughts 0  PHQ-9 Score 5  Difficult doing work/chores Somewhat difficult

## 2019-03-01 ENCOUNTER — Ambulatory Visit (INDEPENDENT_AMBULATORY_CARE_PROVIDER_SITE_OTHER): Payer: BC Managed Care – PPO | Admitting: Gastroenterology

## 2019-03-01 ENCOUNTER — Encounter: Payer: Self-pay | Admitting: Gastroenterology

## 2019-03-01 VITALS — Ht 67.0 in | Wt 305.0 lb

## 2019-03-01 DIAGNOSIS — R140 Abdominal distension (gaseous): Secondary | ICD-10-CM

## 2019-03-01 DIAGNOSIS — K529 Noninfective gastroenteritis and colitis, unspecified: Secondary | ICD-10-CM | POA: Diagnosis not present

## 2019-03-01 MED ORDER — COLESEVELAM HCL 625 MG PO TABS: 1250.0000 mg | ORAL_TABLET | Freq: Every day | ORAL | 1 refills | Status: DC

## 2019-03-01 NOTE — Addendum Note (Signed)
Addended by: Nelida Meuse on: 03/01/2019 04:37 PM   Modules accepted: Orders

## 2019-03-01 NOTE — Progress Notes (Signed)
This patient contacted our office requesting a physician telemedicine consultation regarding clinical questions and/or test results.  If new patient, they were referred by PCp noted below  Participants on the conference : myself and patient   The patient consented to this consultation and was aware that a charge will be placed through their insurance.  I was in my office and the patient was at home   Encounter time:  Total time 35 minutes, with 30 minutes spent with patient on Doximity   _____________________________________________________________________________________________              [facility]:  History: Lauren FredericksonDana Murphy 03/01/2019  Referring provider: Jarold MottoWorley, Samantha, PA  Reason for consult/chief complaint: chronic diarrhea  Subjective  HPI: June 1st primary care clinic Murphy reviewed - outlining struggles with obesity, insomnia, and chronic diarrhea since cholecystectomy in 2003.  Lauren HarmanDana describes having 2-3 loose stools per day with urgency for about the last 15 years, ever since cholecystectomy.  She has bloating sometimes crampy pain before BM relieved afterwards.  Denies rectal bleeding.  No prior GI evaluation for this diarrhea. Rare nocturnal diarrhea, typically only if she has a high-fat meal in the evening.  She denies chronic skin rash, hives, painful eye redness joint swelling or chronic back pain.  It also seems she has not been prescribed any medicines for this in the past. Symptoms are definitely worse with stress, especially at work.  ROS:  Review of Systems  Constitutional: Negative for appetite change and unexpected weight change.  HENT: Negative for mouth sores and voice change.   Eyes: Negative for pain and redness.  Respiratory: Negative for cough and shortness of breath.   Cardiovascular: Negative for chest pain and palpitations.  Genitourinary: Negative for dysuria and hematuria.  Musculoskeletal: Negative for  arthralgias and myalgias.  Skin: Negative for pallor and rash.  Neurological: Negative for weakness and headaches.  Hematological: Negative for adenopathy.     Past Medical History: Past Medical History:  Diagnosis Date  . Cervical high risk HPV (human papillomavirus) test positive 2014, 2015   Colposcopy 2016 - negative.  . CIN I (cervical intraepithelial neoplasia I) 2006, 2008, 2010   [place]  . Febrile seizures ([facility])    as a child--pt.states epileptic seizures     Past Surgical History: Past Surgical History:  Procedure Laterality Date  . CHOLECYSTECTOMY  2003     Family History: Family History  Problem Relation Age of Onset  . Diabetes Mother   . Thyroid disease Mother        hypothyroid  . Hypertension Father   . Diabetes Sister   . Diabetes Maternal Grandmother   . Stroke Maternal Grandmother   . Hypertension Paternal Grandmother   . Stroke Paternal Grandmother   . Diabetes Paternal Grandfather   . Glaucoma Paternal Grandfather   . Diabetes Sister   . Colon cancer Neg Hx   . Stomach cancer Neg Hx   . Pancreatic cancer Neg Hx   . Pancreatic disease Neg Hx     Social History: Social History   Socioeconomic History  . Marital status: Single    Spouse name: Not on file  . Number of children: Not on file  . Years of education: Not on file  . Highest education level: Not on file  Occupational History  . Not on file  Social Needs  . Financial resource strain: Not on file  . Food insecurity    Worry: Not on file  Inability: Not on file  . Transportation needs    Medical: Not on file    Non-medical: Not on file  Tobacco Use  . Smoking status: Never Smoker  . Smokeless tobacco: Never Used  Substance and Sexual Activity  . Alcohol use: Yes    Alcohol/week: 1.0 standard drinks    Types: 1 Glasses of wine per week    Frequency: Never  . Drug use: Never  . Sexual activity: Yes    Birth control/protection: Pill     Comment: [place]  Lifestyle  . Physical activity    Days per week: 1 day    Minutes per session: 30 min  . Stress: Not on file  Relationships  . Social Herbalist on phone: Not on file    Gets together: Not on file    Attends religious service: Not on file    Active member of club or organization: Not on file    Attends meetings of clubs or organizations: Not on file    Relationship status: Not on file  Other Topics Concern  . Not on file  Social History Narrative  . Not on file    Allergies: No Known Allergies  Outpatient Meds: Current Outpatient Medications  Medication Sig Dispense Refill  . AMBULATORY NON FORMULARY MEDICATION Medication Name: [employer] supplement    . norgestrel-ethinyl estradiol (CRYSELLE-28) 0.3-30 MG-MCG tablet Take 1 tablet by mouth daily.    . ondansetron (ZOFRAN) 4 MG tablet Take 1 tablet (4 mg total) by mouth every 8 (eight) hours as needed for nausea or vomiting. 20 tablet 0  . traZODone (DESYREL) 50 MG tablet Take 0.5-1 tablets (25-50 mg total) by mouth at bedtime as needed for sleep. 30 tablet 3   No current facility-administered medications for this visit.     Takes her OCP about 10 PM, her usual bedtime.  ___________________________________________________________________ Objective   Exam:  Ht 5\' 7"  (1.702 m)   Wt (!) 305 lb (138.3 kg)   BMI 47.77 kg/m  During June 1st primary care office Murphy, weight 310 pounds with BMI over 48   No exam - virtual visit She is well-appearing, pleasant and conversational  Labs:  CBC Latest Ref Rng & Units 02/07/2019  WBC 4.0 - 10.5 K/uL 7.5  Hemoglobin 12.0 - 15.0 g/dL 12.6  Hematocrit 36.0 - 46.0 % 37.5  Platelets 150.0 - 400.0 K/uL 269.0   CMP Latest Ref Rng & Units 02/07/2019  Glucose 70 - 99 mg/dL 121(H)  BUN 6 - 23 mg/dL 11  Creatinine 0.40 - 1.20 mg/dL 0.69  Sodium 135 - 145 mEq/L 137  Potassium 3.5 - 5.1 mEq/L 3.6  Chloride 96 - 112 mEq/L 102  CO2 19 - 32 mEq/L 26  Calcium  8.4 - 10.5 mg/dL 8.9  Total Protein 6.0 - 8.3 g/dL 7.2  Total Bilirubin 0.2 - 1.2 mg/dL 0.3  Alkaline Phos 39 - 117 U/L 40  AST 0 - 37 U/L 18  ALT 0 - 35 U/L 17   Lab Results  Component Value Date   TSH 1.76 02/07/2019    No abdominal imaging for review  Assessment: Encounter Diagnoses  Name Primary?  . Chronic diarrhea Yes  . Abdominal bloating     Many elements consistent with IBS, also bile acid diarrhea, given that it began after cholecystectomy.  Discussed these possibilities and available therapies.  I have elected to start WelChol 625 mg, 2 tablets every morning. If no improvement after a week, it  can be increased to 2 tablets twice a day with breakfast and supper.  I explained how this medicine can potentially affect absorption of other medicines and decrease their efficacy, including oral contraceptives.  Therefore, it is essential that she take this medicine at least 2 hours before any other medicines.  Given how she currently takes her OCP, she feels that is manageable.  If that medicine does not help sufficiently, or is limited by side effects, antispasmodic therapy will be tried.  Plan: Low-fat diet recommended.  She was unable to tolerate the keto diet recently because of the fat content.  Prescription was sent to her pharmacy to try welchol  My office will contact her to set up a follow-up telemedicine visit with me in 3 to 4 weeks.  She is encouraged to call sooner as needed.  She will eventually need in person evaluation with me when COVID restrictions allow, as she is a new patient to this practice.  Thank you for the courtesy of this consult.  Please call me with any questions or concerns.  Charlie PitterHenry L Danis III  CC: Referring provider noted above

## 2019-03-02 ENCOUNTER — Encounter: Payer: Self-pay | Admitting: Physician Assistant

## 2019-03-02 NOTE — Telephone Encounter (Signed)
Called pt and schedule appt for Monday with [facility].

## 2019-03-07 ENCOUNTER — Encounter: Payer: Self-pay | Admitting: Physician Assistant

## 2019-03-07 ENCOUNTER — Ambulatory Visit (INDEPENDENT_AMBULATORY_CARE_PROVIDER_SITE_OTHER): Payer: BC Managed Care – PPO | Admitting: Physician Assistant

## 2019-03-07 DIAGNOSIS — G4700 Insomnia, unspecified: Secondary | ICD-10-CM | POA: Diagnosis not present

## 2019-03-07 MED ORDER — HYDROXYZINE HCL 25 MG PO TABS: 25.0000 mg | ORAL_TABLET | Freq: Every evening | ORAL | 0 refills | Status: DC | PRN

## 2019-03-07 NOTE — Progress Notes (Signed)
Virtual Visit via Video   I connected with Aune Adami on 03/07/19 at  3:40 PM EDT by a video enabled telemedicine application and verified that I am speaking with the correct person using two identifiers. Location patient: Home Location provider: [facility], Office Persons participating in the virtual visit: Yeira, Gulden PA-C.   I discussed the limitations of evaluation and management by telemedicine and the availability of in person appointments. The patient expressed understanding and agreed to proceed.   Subjective:   HPI:   Insomnia Last saw me for this on 02/07/19. She was started on 50 mg trazodone and has not had any benefit to her helping her stay asleep -- she continues to wake up around 3-4 am. Denies any caffeine products or use of stimulants. Has anxious thoughts but doesn't think that this is causing her insomnia presently.  ROS: See pertinent positives and negatives per HPI.  Patient Active Problem List   Diagnosis Date Noted  . Diabetes mellitus without complication ([place]) 25/85/2778  . Chronic pain of right knee 12/18/2018    Social History   Tobacco Use  . Smoking status: Never Smoker  . Smokeless tobacco: Never Used  Substance Use Topics  . Alcohol use: Yes    Alcohol/week: 1.0 standard drinks    Types: 1 Glasses of wine per week    Frequency: Never    Current Outpatient Medications:  .  AMBULATORY NON FORMULARY MEDICATION, Medication Name: [employer] supplement, Disp: , Rfl:  .  colesevelam (WELCHOL) 625 MG tablet, Take 2 tablets (1,250 mg total) by mouth daily with breakfast., Disp: 60 tablet, Rfl: 1 .  hydrOXYzine (ATARAX/VISTARIL) 25 MG tablet, Take 1 tablet (25 mg total) by mouth at bedtime as needed for anxiety (insomnia). Take one 25 mg tablet 30-60 minutes prior to bedtime for insomnia, anxiety. May increase to two tablets., Disp: 60 tablet, Rfl: 0 .  norgestrel-ethinyl estradiol (CRYSELLE-28) 0.3-30 MG-MCG tablet, Take 1 tablet by  mouth daily., Disp: , Rfl:  .  ondansetron (ZOFRAN) 4 MG tablet, Take 1 tablet (4 mg total) by mouth every 8 (eight) hours as needed for nausea or vomiting., Disp: 20 tablet, Rfl: 0  No Known Allergies  Objective:   VITALS: Per patient if applicable, see vitals. GENERAL: Alert, appears well and in no acute distress. HEENT: Atraumatic, conjunctiva clear, no obvious abnormalities on inspection of external nose and ears. NECK: Normal movements of the head and neck. CARDIOPULMONARY: No increased WOB. Speaking in clear sentences. I:E ratio WNL.  MS: Moves all visible extremities without noticeable abnormality. PSYCH: Pleasant and cooperative, well-groomed. Speech normal rate and rhythm. Affect is appropriate. Insight and judgement are appropriate. Attention is focused, linear, and appropriate.  NEURO: CN grossly intact. Oriented as arrived to appointment on time with no prompting. Moves both UE equally.  SKIN: No obvious lesions, wounds, erythema, or cyanosis noted on face or hands.  Assessment and Plan:   Genell was seen today for insomnia.  Diagnoses and all orders for this visit:  Insomnia, unspecified type Uncontrolled. Stop Trazodone per patient request. Will trial Atarax. Follow-up if no improvement or worsening.  Other orders -     hydrOXYzine (ATARAX/VISTARIL) 25 MG tablet; Take 1 tablet (25 mg total) by mouth at bedtime as needed for anxiety (insomnia). Take one 25 mg tablet 30-60 minutes prior to bedtime for insomnia, anxiety. May increase to two tablets.    . Reviewed expectations re: course of current medical issues. . Discussed self-management of symptoms. Marland Kitchen  Outlined signs and symptoms indicating need for more acute intervention. . Patient verbalized understanding and all questions were answered. Marland Kitchen. Health Maintenance issues including appropriate healthy diet, exercise, and smoking avoidance were discussed with patient. . See orders for this visit as documented in the  electronic medical record.  I discussed the assessment and treatment plan with the patient. The patient was provided an opportunity to ask questions and all were answered. The patient agreed with the plan and demonstrated an understanding of the instructions.   The patient was advised to call back or seek an in-person evaluation if the symptoms worsen or if the condition fails to improve as anticipated.   CMA or LPN served as scribe during this visit. History, Physical, and Plan performed by medical provider. The above documentation has been reviewed and is accurate and complete.   LakemoreSamantha Srihan Brutus, GeorgiaPA 03/07/2019

## 2019-03-30 ENCOUNTER — Ambulatory Visit: Payer: BC Managed Care – PPO | Admitting: Gastroenterology

## 2019-04-06 DIAGNOSIS — H6123 Impacted cerumen, bilateral: Secondary | ICD-10-CM | POA: Diagnosis not present

## 2019-04-06 DIAGNOSIS — H608X3 Other otitis externa, bilateral: Secondary | ICD-10-CM | POA: Diagnosis not present

## 2019-05-20 DIAGNOSIS — J0110 Acute frontal sinusitis, unspecified: Secondary | ICD-10-CM | POA: Diagnosis not present

## 2019-05-27 ENCOUNTER — Ambulatory Visit: Payer: Self-pay

## 2019-05-27 NOTE — Telephone Encounter (Signed)
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 Lauren Murphy.  Lauren Murphy states that she does not have any Sx. Patent last seen Pt. Wednesday.  Was with the Lauren Murphy who has the virus no that an hour on 05/25/19. DEnies any Sx.  Provided care advice.  Lauren Murphy wants to know if she should take a test.  Explained to Lauren Murphy the protocol. Would like to discuss with Park LiterSamantha Worly, PA      Reason for Disposition . COVID-19 Home Isolation, questions about  Answer Assessment - Initial Assessment Questions 1. CLOSE CONTACT: "Who is the person with the confirmed or suspected COVID-19 infection that you were exposed to?"      Pt she work s for has been tested positive 2. PLACE of CONTACT: "Where were you when you were exposed to COVID-19?" (e.g., home, school, medical waiting room; which city?)     At Patients home.    3. TYPE of CONTACT: "How much contact was there?" (e.g., sitting next to, live in same house, work in same office, same building)     1/2 hour 4. DURATION of CONTACT: "How long were you in contact with the COVID-19 Lauren Murphy?" (e.g., a few seconds, passed by person, a few minutes, live with the Lauren Murphy)    1/2 hour no more than a hour 5. DATE of CONTACT: "When did you have contact with a COVID-19 Lauren Murphy?" (e.g., how many days ago)   9l16/20   6. TRAVEL: "Have you traveled out of the country recently?" If so, "When and where?"     * Also ask about out-of-state travel, since the CDC has identified some high-risk cities for community spread in the [place].     * Note: Travel becomes less relevant if there is widespread community transmission where the Lauren Murphy lives.     *No Answer* 7. COMMUNITY SPREAD: "Are there lots of cases of COVID-19 (community spread) where you live?" (See public health department website, if unsure)       unknown 8. SYMPTOMS: "Do you have any symptoms?" (e.g., fever, cough, breathing difficulty)     denies 9.  PREGNANCY OR POSTPARTUM: "Is there any chance you are pregnant?" "When was your last menstrual period?" "Did you deliver in the last 2 weeks?"      10. HIGH RISK: "Do you have any heart or lung problems? Do you have a weak immune system?" (e.g., CHF, COPD, asthma, HIV positive, chemotherapy, renal failure, diabetes mellitus, sickle cell anemia)      Denies  Answer Assessment - Initial Assessment Questions 1. COVID-19 DIAGNOSIS: "Who made your Coronavirus (COVID-19) diagnosis?" "Was it confirmed by a positive lab test?" If not diagnosed by a HCP, ask "Are there lots of cases (community spread) where you live?" (See public health department website, if unsure)        2. ONSET: "When did the COVID-19 symptoms start?"      *No Answer* 3. WORST SYMPTOM: "What is your worst symptom?" (e.g., cough, fever, shortness of breath, muscle aches)     *No Answer* 4. COUGH: "Do you have a cough?" If so, ask: "How bad is the cough?"       *No Answer* 5. FEVER: "Do you have a fever?" If so, ask: "What is your temperature, how was it measured, and when did it start?"     *No Answer* 6. RESPIRATORY STATUS: "Describe your breathing?" (e.g., shortness of breath, wheezing, unable to speak)      *  No Answer* 7. BETTER-SAME-WORSE: "Are you getting better, staying the same or getting worse compared to yesterday?"  If getting worse, ask, "In what way?"     *No Answer* 8. HIGH RISK DISEASE: "Do you have any chronic medical problems?" (e.g., asthma, heart or lung disease, weak immune system, etc.)     *No Answer* 9. PREGNANCY: "Is there any chance you are pregnant?" "When was your last menstrual period?"     *No Answer* 10. OTHER SYMPTOMS: "Do you have any other symptoms?"  (e.g., chills, fatigue, headache, loss of smell or taste, muscle pain, sore throat)       *No Answer*  Protocols used: CORONAVIRUS (COVID-19) DIAGNOSED OR SUSPECTED-A-AH, CORONAVIRUS (COVID-19) EXPOSURE-A-AH

## 2019-05-30 NOTE — Telephone Encounter (Signed)
I contacted the patient to schedule, patient stated she was tested over the weekend and was awaiting results. No appointment needed.

## 2019-05-30 NOTE — Telephone Encounter (Signed)
Contact patient to schedule virtual appointment

## 2019-05-30 NOTE — Telephone Encounter (Signed)
FYI, see message. 

## 2019-06-23 DIAGNOSIS — B3741 Candidal cystitis and urethritis: Secondary | ICD-10-CM | POA: Diagnosis not present

## 2019-06-24 DIAGNOSIS — B009 Herpesviral infection, unspecified: Secondary | ICD-10-CM | POA: Diagnosis not present

## 2019-07-07 ENCOUNTER — Telehealth: Payer: Self-pay | Admitting: Obstetrics and Gynecology

## 2019-07-07 NOTE — Telephone Encounter (Signed)
Left message on voicemail to call and reschedule cancelled appointment. °

## 2019-07-15 DIAGNOSIS — S62667A Nondisplaced fracture of distal phalanx of left little finger, initial encounter for closed fracture: Secondary | ICD-10-CM | POA: Diagnosis not present

## 2019-07-15 DIAGNOSIS — M20012 Mallet finger of left finger(s): Secondary | ICD-10-CM | POA: Diagnosis not present

## 2019-07-18 DIAGNOSIS — S62637A Displaced fracture of distal phalanx of left little finger, initial encounter for closed fracture: Secondary | ICD-10-CM | POA: Diagnosis not present

## 2019-07-18 DIAGNOSIS — M79645 Pain in left finger(s): Secondary | ICD-10-CM | POA: Diagnosis not present

## 2019-07-18 DIAGNOSIS — M25642 Stiffness of left hand, not elsewhere classified: Secondary | ICD-10-CM | POA: Diagnosis not present

## 2019-07-18 DIAGNOSIS — M7989 Other specified soft tissue disorders: Secondary | ICD-10-CM | POA: Diagnosis not present

## 2019-07-22 ENCOUNTER — Ambulatory Visit: Payer: 59 | Admitting: Obstetrics and Gynecology

## 2019-07-25 ENCOUNTER — Other Ambulatory Visit: Payer: Self-pay

## 2019-07-25 ENCOUNTER — Ambulatory Visit (INDEPENDENT_AMBULATORY_CARE_PROVIDER_SITE_OTHER): Payer: BC Managed Care – PPO | Admitting: Obstetrics and Gynecology

## 2019-07-25 ENCOUNTER — Encounter: Payer: Self-pay | Admitting: Obstetrics and Gynecology

## 2019-07-25 VITALS — BP 122/84 | HR 60 | Temp 96.7°F | Resp 16 | Ht 66.0 in | Wt 307.8 lb

## 2019-07-25 DIAGNOSIS — Z01419 Encounter for gynecological examination (general) (routine) without abnormal findings: Secondary | ICD-10-CM | POA: Diagnosis not present

## 2019-07-25 DIAGNOSIS — Z113 Encounter for screening for infections with a predominantly sexual mode of transmission: Secondary | ICD-10-CM | POA: Diagnosis not present

## 2019-07-25 DIAGNOSIS — S62637A Displaced fracture of distal phalanx of left little finger, initial encounter for closed fracture: Secondary | ICD-10-CM | POA: Diagnosis not present

## 2019-07-25 MED ORDER — NYSTATIN 100000 UNIT/GM EX POWD: Freq: Three times a day (TID) | CUTANEOUS | 2 refills | Status: DC

## 2019-07-25 NOTE — Progress Notes (Signed)
38 y.o. [id number] Single African American female here for annual exam.    Patient complaining of rash under right breast.  Increased cramping with her last menstruation.  She has a sharp pain with orgasm.  It lasts for less than one minute. No missed pills.   She feels like she has to wipe more after she voids.   She has a lump under her chin and is to do an [place] with her PCP.   States her A1C was 7.6 so she is now diabetic.  Difficulty tolerating some of the medications due to her IBS.  She has done telemedicine for vaginitis and for oral HSV 1 care.  PCP: Inda Coke, PA    Patient's last menstrual period was 06/20/2019 (exact date).     Period Cycle (Days): 90 Period Duration (Days): 4 days Period Pattern: Regular Menstrual Flow: Light Menstrual Control: Maxi pad Dysmenorrhea: None     Sexually active: Yes.    The current method of family planning is OCP (estrogen/progesterone)--continuously for 90 days.    Exercising: No.  The patient does not participate in regular exercise at present. Smoker:  no  Health Maintenance: Pap: 07-19-18 Neg:Neg HR HPV, 01-02-17 Neg History of abnormal Pap:  Yes, hx of 2 colposcopies but no treatment to cervix--last colpo 2017.  Records received showing normal pap and neg HR HPV in 2017 and normal pap and neg HR HPV in 2018.  MMG:  n/a Colonoscopy:  n/a BMD:   n/a  Result  n/a TDaP:  2017 Gardasil:   No.  Declines.  HIV:07-19-18 NR Hep C:07-19-18 Neg Screening Labs:  PCP.  Flu vaccine:  Declines.   reports that she has never smoked. She has never used smokeless tobacco. She reports previous alcohol use. She reports that she does not use drugs.  Past Medical History:  Diagnosis Date  . Cervical high risk HPV (human papillomavirus) test positive 2014, 2015   Colposcopy 2016 - negative.  . CIN I (cervical intraepithelial neoplasia I) 2006, 2008, 2010   col[place]copic biopsies - [place]  . Diabetes mellitus without complication ([place])    . Febrile seizures ([place])    as a child--pt.states epileptic seizures  . HSV-1 (herpes simplex virus 1) infection     Past Surgical History:  Procedure Laterality Date  . CHOLECYSTECTOMY  2003    Current Outpatient Medications  Medication Sig Dispense Refill  . colesevelam (WELCHOL) 625 MG tablet Take 2 tablets (1,250 mg total) by mouth daily with breakfast. 60 tablet 1  . norgestrel-ethinyl estradiol (CRYSELLE-28) 0.3-30 MG-MCG tablet Take 1 tablet by mouth daily.    . traZODone (DESYREL) 50 MG tablet TAKE 1/2  1 TABLETS (25 50 MG TOTAL) BY MOUTH AT BEDTIME AS NEEDED FOR SLEEP.     No current facility-administered medications for this visit.     Family History  Problem Relation Age of Onset  . Diabetes Mother   . Thyroid disease Mother        hypothyroid  . Hypertension Father   . Diabetes Sister   . Diabetes Maternal Grandmother   . Stroke Maternal Grandmother   . Hypertension Paternal Grandmother   . Stroke Paternal Grandmother   . Diabetes Paternal Grandfather   . Glaucoma Paternal Grandfather   . Diabetes Sister   . Colon cancer Neg Hx   . Stomach cancer Neg Hx   . Pancreatic cancer Neg Hx   . Pancreatic disease Neg Hx     Review of Systems  All other  systems reviewed and are negative.   Exam:   BP 122/84 (Cuff Size: Large)   Pulse 60   Temp (!) 96.7 F (35.9 C) (Temporal)   Resp 16   Ht 5\' 6"  (1.676 m)   Wt (!) 307 lb 12.8 oz (139.6 kg)   LMP 06/20/2019 (Exact Date)   BMI 49.68 kg/m     General appearance: alert, cooperative and appears stated age Head: normocephalic, without obvious abnormality, atraumatic Neck: no adenopathy, supple, symmetrical, trachea midline and thyroid normal to inspection and palpation.  Soft tissue mass under her chin in midline.  Nontender. Lungs: clear to auscultation bilaterally Breasts: normal appearance, no masses or tenderness, No nipple retraction or dimpling, No nipple discharge or bleeding, No axillary  adenopathy Heart: regular rate and rhythm Abdomen: soft, non-tender; no masses, no organomegaly Extremities: extremities normal, atraumatic, no cyanosis or edema Skin: skin color, texture, turgor normal. No rashes or lesions Lymph nodes: cervical, supraclavicular, and axillary nodes normal. Neurologic: grossly normal  Pelvic: External genitalia:  no lesions              No abnormal inguinal nodes palpated.              Urethra:  normal appearing urethra with no masses, tenderness or lesions              Bartholins and Skenes: normal                 Vagina: normal appearing vagina with normal color and discharge, no lesions              Cervix: no lesions              Pap taken: No. Bimanual Exam:  Uterus:  normal size, contour, position, consistency, mobility, non-tender              Adnexa: no mass, fullness, tenderness              Chaperone was present for exam.  Assessment:   Well woman visit with gynecology exam.  Pain with orgasm. Soft tissue mass of neck. DM.  IBS-D.  Plan: Mammogram screening discussed. Self breast awareness reviewed. Pap and HR HPV 2024.  Guidelines for Calcium, Vitamin D, regular exercise program including cardiovascular and weight bearing exercise. We discussed weight loss as a way to reduce her blood sugar.  Serum STD screening.  She asked for STD testing after her pelvic exam was completed.  She was given option of giving a voided urine, but was unable to do so.  She will return another day to give a sample to complete this testing.  She refills her OCPs on line.  Ibuprofen prior to intercourse? She will follow up with her PCP for an 2025 of her neck.  Follow up annually and prn.   After visit summary provided.

## 2019-07-25 NOTE — Patient Instructions (Signed)

## 2019-07-26 LAB — HEPATITIS C ANTIBODY: Hep C Virus Ab: <0.1 s/co ratio (ref 0.0–0.9)

## 2019-07-26 LAB — HEP, RPR, HIV PANEL
HIV Screen 4th Generation wRfx: NONREACTIVE
Hepatitis B Surface Ag: NEGATIVE
RPR Ser Ql: NONREACTIVE

## 2019-07-29 DIAGNOSIS — M7989 Other specified soft tissue disorders: Secondary | ICD-10-CM | POA: Diagnosis not present

## 2019-07-29 DIAGNOSIS — S62637A Displaced fracture of distal phalanx of left little finger, initial encounter for closed fracture: Secondary | ICD-10-CM | POA: Diagnosis not present

## 2019-07-29 DIAGNOSIS — M79645 Pain in left finger(s): Secondary | ICD-10-CM | POA: Diagnosis not present

## 2019-07-29 DIAGNOSIS — M25642 Stiffness of left hand, not elsewhere classified: Secondary | ICD-10-CM | POA: Diagnosis not present

## 2019-08-05 ENCOUNTER — Ambulatory Visit: Payer: Self-pay | Admitting: *Deleted

## 2019-08-05 NOTE — Telephone Encounter (Signed)
Pt called with an elevated temp of 102.7, just now. She just now took Tylenol. She has chest congestion. Along with chills and headache. She also has body aches. She has been taking ibuprofen. She is advised to drink a lot of liquids, and treat her symptoms with alternation Tylenol and ibuprofen. To take honey at bedtime to help with her cough. To take mucinex for the cough and congestion. She should check her temp every 4 hours.  If she starts having shortness of breath, struggling to breath to get to the ED. If she does not feel better by morning to go to an urgent care. She voiced understanding. Routing to LB at [employer].    Reason for Disposition . [1] Fever > 100.0 F (37.8 C) AND [2] diabetes mellitus or weak immune system (e.g., HIV positive, cancer chemo, splenectomy, organ transplant, chronic steroids)  Answer Assessment - Initial Assessment Questions 1. TEMPERATURE: "What is the most recent temperature?"  "How was it measured?"      102.7 2. ONSET: "When did the fever start?"      today 3. SYMPTOMS: "Do you have any other symptoms besides the fever?"  (e.g., colds, headache, sore throat, earache, cough, rash, diarrhea, vomiting, abdominal pain)     Cough, chest congestion, chills, headache 4. CAUSE: If there are no symptoms, ask: "What do you think is causing the fever?"      Not sure 5. CONTACTS: "Does anyone else in the family have an infection?"     no 6. TREATMENT: "What have you done so far to treat this fever?" (e.g., medications)    medications 7. IMMUNOCOMPROMISE: "Do you have of the following: diabetes, HIV positive, splenectomy, cancer chemotherapy, chronic steroid treatment, transplant patient, etc."     diabetes 8. PREGNANCY: "Is there any chance you are pregnant?" "When was your last menstrual period?"     9. TRAVEL: "Have you traveled out of the country in the last month?" (e.g., travel history, exposures)  Protocols used: FEVER-A-AH

## 2019-08-06 DIAGNOSIS — U071 COVID-19: Secondary | ICD-10-CM | POA: Diagnosis not present

## 2019-08-06 DIAGNOSIS — J209 Acute bronchitis, unspecified: Secondary | ICD-10-CM | POA: Diagnosis not present

## 2019-08-08 ENCOUNTER — Encounter: Payer: Self-pay | Admitting: Physician Assistant

## 2019-08-08 ENCOUNTER — Encounter: Payer: Self-pay | Admitting: *Deleted

## 2019-08-08 LAB — NOVEL CORONAVIRUS, NAA: SARS-CoV-2, NAA: POSITIVE

## 2019-08-08 NOTE — Telephone Encounter (Signed)
My chart message sent to pt to see how she is.

## 2019-08-08 NOTE — Telephone Encounter (Signed)
See note

## 2019-08-09 ENCOUNTER — Ambulatory Visit (INDEPENDENT_AMBULATORY_CARE_PROVIDER_SITE_OTHER): Payer: BC Managed Care – PPO | Admitting: Physician Assistant

## 2019-08-09 ENCOUNTER — Encounter: Payer: Self-pay | Admitting: Physician Assistant

## 2019-08-09 VITALS — Temp 100.6°F

## 2019-08-09 DIAGNOSIS — Z7189 Other specified counseling: Secondary | ICD-10-CM

## 2019-08-09 DIAGNOSIS — U071 COVID-19: Secondary | ICD-10-CM

## 2019-08-09 MED ORDER — PREDNISONE 20 MG PO TABS: 40.0000 mg | ORAL_TABLET | Freq: Every day | ORAL | 0 refills | Status: DC

## 2019-08-09 NOTE — Telephone Encounter (Signed)
Please call pt and schedule virtual visit for work excuse pt positive for COVID per Aldona Bar.

## 2019-08-09 NOTE — Progress Notes (Signed)
Virtual Visit via Video   I connected with Lauren Murphy on 08/09/19 at  3:40 PM EST by a video enabled telemedicine application and verified that I am speaking with the correct person using two identifiers. Location patient: Home Location provider: [facility], Office Persons participating in the virtual visit: Skylor, Hughson PA-C  I discussed the limitations of evaluation and management by telemedicine and the availability of in person appointments. The patient expressed understanding and agreed to proceed.  SCRIBE ATTESTATION  Subjective:   HPI:   Patient is COVID-19 positive.  Symptom onset: Wednesday; got tested at [facility] -- found out results two days ago. Had appointment with [facility] on Saturday prior to finding out dx and was given azithromycin, symbicort, tessalon perles.   Patient endorses the following symptoms: Fever (102.7), productive cough (greenish-yellow), shortness of breath and myalgia; headache, nausea, loss of taste/smell  Patient denies the following symptoms: chest pain, changes in vision, unilateral calf swelling/tenderness  Treatments tried: antitussives, antibiotics, upright position or inhalers: symbicort  Overall symptoms are improving with time.  Patient risk factors: Current [id number] risk of complications score: 2 Smoking status: Danaiya Steadman  reports that she has never smoked. She has never used smokeless tobacco. If female, currently pregnant? []   Yes [x]   No  ROS: See pertinent positives and negatives per HPI.  Patient Active Problem List   Diagnosis Date Noted  . Diabetes mellitus without complication ([place]) 29/56/2130  . Chronic pain of right knee 12/18/2018    Social History   Tobacco Use  . Smoking status: Never Smoker  . Smokeless tobacco: Never Used  Substance Use Topics  . Alcohol use: Not Currently    Frequency: Never    Comment: rarely    Current Outpatient Medications:  .  azithromycin  (ZITHROMAX) 250 MG tablet, Take 250 mg by mouth daily. Take two on day 2, then one each day after, Disp: , Rfl:  .  benzonatate (TESSALON) 100 MG capsule, Take by mouth 2 (two) times daily as needed for cough., Disp: , Rfl:  .  budesonide-formoterol (SYMBICORT) 160-4.5 MCG/ACT inhaler, Inhale 2 puffs into the lungs 2 (two) times daily., Disp: , Rfl:  .  naproxen (NAPROSYN) 375 MG tablet, Take 375 mg by mouth 2 (two) times daily with a meal., Disp: , Rfl:  .  colesevelam (WELCHOL) 625 MG tablet, Take 2 tablets (1,250 mg total) by mouth daily with breakfast., Disp: 60 tablet, Rfl: 1 .  norgestrel-ethinyl estradiol (CRYSELLE-28) 0.3-30 MG-MCG tablet, Take 1 tablet by mouth daily., Disp: , Rfl:  .  nystatin (MYCOSTATIN/NYSTOP) powder, Apply topically 3 (three) times daily. Apply to affected area for up to 7 days, Disp: 30 g, Rfl: 2 .  predniSONE (DELTASONE) 20 MG tablet, Take 2 tablets (40 mg total) by mouth daily., Disp: 10 tablet, Rfl: 0 .  traZODone (DESYREL) 50 MG tablet, TAKE 1/2  1 TABLETS (25 50 MG TOTAL) BY MOUTH AT BEDTIME AS NEEDED FOR SLEEP., Disp: , Rfl:   No Known Allergies  Objective:   VITALS: Per patient if applicable, see vitals. GENERAL: Alert, appears well and in no acute distress. HEENT: Atraumatic, conjunctiva clear, no obvious abnormalities on inspection of external nose and ears. NECK: Normal movements of the head and neck. CARDIOPULMONARY: No increased WOB. Speaking in clear sentences. I:E ratio WNL.  MS: Moves all visible extremities without noticeable abnormality. PSYCH: Pleasant and cooperative, well-groomed. Speech normal rate and rhythm. Affect is appropriate. Insight and judgement are appropriate. Attention is  focused, linear, and appropriate.  NEURO: CN grossly intact. Oriented as arrived to appointment on time with no prompting. Moves both UE equally.  SKIN: No obvious lesions, wounds, erythema, or cyanosis noted on face or hands.  Assessment and Plan:    Diagnoses and all orders for this visit:  COVID-19 -     Temperature monitoring; Future  Advice given about COVID-19 virus infection -     Temperature monitoring; Future  Other orders -     predniSONE (DELTASONE) 20 MG tablet; Take 2 tablets (40 mg total) by mouth daily. -     MyChart COVID-19 home monitoring program; Future   Patient has a respiratory illness without signs of acute distress or respiratory compromise at this time.   Will stop azithromycin antibiotic. Will start brief oral prednisone course to see if this will help with her breathing. She has a pulse ox and knows how to monitor this. She has also agreed to sign up for MyChart COVID-19 Companion for further monitoring.  Overall she is feeling improved. Stop oral antibiotic, Azithromycin. Strict precautions given, and she was recommended to go to the ER if she has any worsening or sudden change in symptoms.   Advised if they experience a "second sickening" or worsening symptoms as the illness progresses, they are to call the office for further instructions or seek emergent evaluation for any severe symptoms.   . Reviewed expectations re: course of current medical issues. . Discussed self-management of symptoms. . Outlined signs and symptoms indicating need for more acute intervention. . Patient verbalized understanding and all questions were answered. Marland Kitchen Health Maintenance issues including appropriate healthy diet, exercise, and smoking avoidance were discussed with patient. . See orders for this visit as documented in the electronic medical record.  I discussed the assessment and treatment plan with the patient. The patient was provided an opportunity to ask questions and all were answered. The patient agreed with the plan and demonstrated an understanding of the instructions.   The patient was advised to call back or seek an in-person evaluation if the symptoms worsen or if the condition fails to improve as anticipated.    CMA or LPN served as scribe during this visit. History, Physical, and Plan performed by medical provider. The above documentation has been reviewed and is accurate and complete.  [place], [place] 08/09/2019

## 2019-08-09 NOTE — Telephone Encounter (Signed)
Tried calling patient vm full to set up appt.

## 2019-08-10 ENCOUNTER — Encounter (HOSPITAL_COMMUNITY): Payer: Self-pay | Admitting: Emergency Medicine

## 2019-08-10 ENCOUNTER — Other Ambulatory Visit: Payer: Self-pay

## 2019-08-10 ENCOUNTER — Emergency Department (HOSPITAL_COMMUNITY): Payer: BC Managed Care – PPO

## 2019-08-10 ENCOUNTER — Inpatient Hospital Stay (HOSPITAL_COMMUNITY)
Admission: EM | Admit: 2019-08-10 | Discharge: 2019-08-15 | DRG: 177 | Disposition: A | Discharge status: Home / Self Care | Payer: BC Managed Care – PPO | Attending: Internal Medicine | Admitting: Internal Medicine

## 2019-08-10 DIAGNOSIS — Z79899 Other long term (current) drug therapy: Secondary | ICD-10-CM | POA: Diagnosis not present

## 2019-08-10 DIAGNOSIS — E1165 Type 2 diabetes mellitus with hyperglycemia: Secondary | ICD-10-CM | POA: Diagnosis present

## 2019-08-10 DIAGNOSIS — Z7952 Long term (current) use of systemic steroids: Secondary | ICD-10-CM

## 2019-08-10 DIAGNOSIS — E785 Hyperlipidemia, unspecified: Secondary | ICD-10-CM | POA: Diagnosis not present

## 2019-08-10 DIAGNOSIS — J1282 Pneumonia due to coronavirus disease 2019: Secondary | ICD-10-CM | POA: Diagnosis present

## 2019-08-10 DIAGNOSIS — Z833 Family history of diabetes mellitus: Secondary | ICD-10-CM | POA: Diagnosis not present

## 2019-08-10 DIAGNOSIS — J1289 Other viral pneumonia: Secondary | ICD-10-CM | POA: Diagnosis not present

## 2019-08-10 DIAGNOSIS — R0902 Hypoxemia: Secondary | ICD-10-CM | POA: Diagnosis present

## 2019-08-10 DIAGNOSIS — Z79890 Hormone replacement therapy (postmenopausal): Secondary | ICD-10-CM

## 2019-08-10 DIAGNOSIS — I517 Cardiomegaly: Secondary | ICD-10-CM | POA: Diagnosis present

## 2019-08-10 DIAGNOSIS — Z6841 Body Mass Index (BMI) 40.0 and over, adult: Secondary | ICD-10-CM

## 2019-08-10 DIAGNOSIS — Z743 Need for continuous supervision: Secondary | ICD-10-CM | POA: Diagnosis not present

## 2019-08-10 DIAGNOSIS — G8929 Other chronic pain: Secondary | ICD-10-CM | POA: Diagnosis not present

## 2019-08-10 DIAGNOSIS — R0602 Shortness of breath: Secondary | ICD-10-CM | POA: Diagnosis not present

## 2019-08-10 DIAGNOSIS — M25561 Pain in right knee: Secondary | ICD-10-CM | POA: Diagnosis not present

## 2019-08-10 DIAGNOSIS — J9601 Acute respiratory failure with hypoxia: Secondary | ICD-10-CM | POA: Diagnosis not present

## 2019-08-10 DIAGNOSIS — Z9049 Acquired absence of other specified parts of digestive tract: Secondary | ICD-10-CM

## 2019-08-10 DIAGNOSIS — U071 COVID-19: Secondary | ICD-10-CM | POA: Diagnosis not present

## 2019-08-10 DIAGNOSIS — R7401 Elevation of levels of liver transaminase levels: Secondary | ICD-10-CM | POA: Diagnosis not present

## 2019-08-10 DIAGNOSIS — R279 Unspecified lack of coordination: Secondary | ICD-10-CM | POA: Diagnosis not present

## 2019-08-10 HISTORY — DX: COVID-19: U07.1

## 2019-08-10 LAB — CBC WITH DIFFERENTIAL/PLATELET
Abs Immature Granulocytes: 0.02 10*3/uL (ref 0.00–0.07)
Basophils Absolute: 0 10*3/uL (ref 0.0–0.1)
Basophils Relative: 0 %
Eosinophils Absolute: 0 10*3/uL (ref 0.0–0.5)
Eosinophils Relative: 0 %
HCT: 40.2 % (ref 36.0–46.0)
Hemoglobin: 13.2 g/dL (ref 12.0–15.0)
Immature Granulocytes: 0 %
Lymphocytes Relative: 14 %
Lymphs Abs: 0.8 10*3/uL (ref 0.7–4.0)
MCH: 29.7 pg (ref 26.0–34.0)
MCHC: 32.8 g/dL (ref 30.0–36.0)
MCV: 90.3 fL (ref 80.0–100.0)
Monocytes Absolute: 0.2 10*3/uL (ref 0.1–1.0)
Monocytes Relative: 3 %
Neutro Abs: 5 10*3/uL (ref 1.7–7.7)
Neutrophils Relative %: 83 %
Platelets: 239 10*3/uL (ref 150–400)
RBC: 4.45 MIL/uL (ref 3.87–5.11)
RDW: 12.3 % (ref 11.5–15.5)
WBC: 6 10*3/uL (ref 4.0–10.5)
nRBC: 0 % (ref 0.0–0.2)

## 2019-08-10 LAB — COMPREHENSIVE METABOLIC PANEL
ALT: 66 U/L — ABNORMAL HIGH (ref 0–44)
AST: 64 U/L — ABNORMAL HIGH (ref 15–41)
Albumin: 3.2 g/dL — ABNORMAL LOW (ref 3.5–5.0)
Alkaline Phosphatase: 57 U/L (ref 38–126)
Anion gap: 13 (ref 5–15)
BUN: 8 mg/dL (ref 6–20)
CO2: 21 mmol/L — ABNORMAL LOW (ref 22–32)
Calcium: 9 mg/dL (ref 8.9–10.3)
Chloride: 100 mmol/L (ref 98–111)
Creatinine, Ser: 0.93 mg/dL (ref 0.44–1.00)
GFR calc Af Amer: >60 mL/min (ref >60)
GFR calc non Af Amer: >60 mL/min (ref >60)
Glucose, Bld: 381 mg/dL — ABNORMAL HIGH (ref 70–99)
Potassium: 3.9 mmol/L (ref 3.5–5.1)
Sodium: 134 mmol/L — ABNORMAL LOW (ref 135–145)
Total Bilirubin: 0.5 mg/dL (ref 0.3–1.2)
Total Protein: 7.1 g/dL (ref 6.5–8.1)

## 2019-08-10 LAB — URINALYSIS, ROUTINE W REFLEX MICROSCOPIC
Bilirubin Urine: NEGATIVE
Glucose, UA: >=500 mg/dL — AB
Ketones, ur: NEGATIVE mg/dL
Leukocytes,Ua: NEGATIVE
Nitrite: NEGATIVE
Protein, ur: 30 mg/dL — AB
Specific Gravity, Urine: 1.026 (ref 1.005–1.030)
pH: 6 (ref 5.0–8.0)

## 2019-08-10 LAB — I-STAT BETA HCG BLOOD, ED (MC, WL, AP ONLY): I-stat hCG, quantitative: <5 m[IU]/mL (ref <5)

## 2019-08-10 NOTE — ED Triage Notes (Signed)
Patient tested positive for Covid 19 last week , reports persistent  SOB with productive cough , fever, chills , loss of taste and smell.

## 2019-08-11 ENCOUNTER — Encounter (HOSPITAL_COMMUNITY): Payer: Self-pay | Admitting: Radiology

## 2019-08-11 ENCOUNTER — Emergency Department (HOSPITAL_COMMUNITY): Payer: BC Managed Care – PPO

## 2019-08-11 DIAGNOSIS — R7401 Elevation of levels of liver transaminase levels: Secondary | ICD-10-CM

## 2019-08-11 DIAGNOSIS — J1282 Pneumonia due to coronavirus disease 2019: Secondary | ICD-10-CM | POA: Diagnosis present

## 2019-08-11 DIAGNOSIS — E1165 Type 2 diabetes mellitus with hyperglycemia: Secondary | ICD-10-CM | POA: Diagnosis present

## 2019-08-11 DIAGNOSIS — U071 COVID-19: Secondary | ICD-10-CM | POA: Diagnosis present

## 2019-08-11 DIAGNOSIS — Z833 Family history of diabetes mellitus: Secondary | ICD-10-CM | POA: Diagnosis not present

## 2019-08-11 DIAGNOSIS — R0902 Hypoxemia: Secondary | ICD-10-CM | POA: Diagnosis present

## 2019-08-11 DIAGNOSIS — E785 Hyperlipidemia, unspecified: Secondary | ICD-10-CM | POA: Diagnosis present

## 2019-08-11 DIAGNOSIS — J1289 Other viral pneumonia: Secondary | ICD-10-CM

## 2019-08-11 DIAGNOSIS — J9601 Acute respiratory failure with hypoxia: Secondary | ICD-10-CM | POA: Diagnosis present

## 2019-08-11 DIAGNOSIS — Z9049 Acquired absence of other specified parts of digestive tract: Secondary | ICD-10-CM | POA: Diagnosis not present

## 2019-08-11 DIAGNOSIS — Z6841 Body Mass Index (BMI) 40.0 and over, adult: Secondary | ICD-10-CM | POA: Diagnosis not present

## 2019-08-11 DIAGNOSIS — I517 Cardiomegaly: Secondary | ICD-10-CM | POA: Diagnosis present

## 2019-08-11 DIAGNOSIS — G8929 Other chronic pain: Secondary | ICD-10-CM | POA: Diagnosis present

## 2019-08-11 DIAGNOSIS — Z7952 Long term (current) use of systemic steroids: Secondary | ICD-10-CM | POA: Diagnosis not present

## 2019-08-11 DIAGNOSIS — Z79899 Other long term (current) drug therapy: Secondary | ICD-10-CM | POA: Diagnosis not present

## 2019-08-11 DIAGNOSIS — Z79890 Hormone replacement therapy (postmenopausal): Secondary | ICD-10-CM | POA: Diagnosis not present

## 2019-08-11 DIAGNOSIS — M25561 Pain in right knee: Secondary | ICD-10-CM | POA: Diagnosis present

## 2019-08-11 LAB — TYPE AND SCREEN
ABO/RH(D): B POS
Antibody Screen: NEGATIVE

## 2019-08-11 LAB — HEMOGLOBIN A1C
Hgb A1c MFr Bld: 7.7 % — ABNORMAL HIGH (ref 4.8–5.6)
Mean Plasma Glucose: 174.29 mg/dL

## 2019-08-11 LAB — CBG MONITORING, ED
Glucose-Capillary: 179 mg/dL — ABNORMAL HIGH (ref 70–99)
Glucose-Capillary: 263 mg/dL — ABNORMAL HIGH (ref 70–99)
Glucose-Capillary: 279 mg/dL — ABNORMAL HIGH (ref 70–99)

## 2019-08-11 LAB — BRAIN NATRIURETIC PEPTIDE: B Natriuretic Peptide: 13.9 pg/mL (ref 0.0–100.0)

## 2019-08-11 LAB — HIV ANTIBODY (ROUTINE TESTING W REFLEX): HIV Screen 4th Generation wRfx: NONREACTIVE

## 2019-08-11 LAB — FERRITIN: Ferritin: 487 ng/mL — ABNORMAL HIGH (ref 11–307)

## 2019-08-11 LAB — C-REACTIVE PROTEIN: CRP: 2.2 mg/dL — ABNORMAL HIGH (ref <1.0)

## 2019-08-11 LAB — TROPONIN I (HIGH SENSITIVITY)
Troponin I (High Sensitivity): 4 ng/L (ref <18)
Troponin I (High Sensitivity): 5 ng/L (ref <18)

## 2019-08-11 LAB — HEPATITIS B SURFACE ANTIGEN: Hepatitis B Surface Ag: NONREACTIVE

## 2019-08-11 LAB — LACTATE DEHYDROGENASE: LDH: 267 U/L — ABNORMAL HIGH (ref 98–192)

## 2019-08-11 LAB — PROCALCITONIN: Procalcitonin: <0.1 ng/mL

## 2019-08-11 LAB — FIBRINOGEN: Fibrinogen: 437 mg/dL (ref 210–475)

## 2019-08-11 LAB — ABO/RH: ABO/RH(D): B POS

## 2019-08-11 LAB — D-DIMER, QUANTITATIVE: D-Dimer, Quant: 0.72 ug/mL-FEU — ABNORMAL HIGH (ref 0.00–0.50)

## 2019-08-11 MED ORDER — INSULIN ASPART 100 UNIT/ML ~~LOC~~ SOLN
0.0000 [IU] | Freq: Three times a day (TID) | SUBCUTANEOUS | Status: DC
  Administered 2019-08-12 (×2): 5 [IU] via SUBCUTANEOUS
  Administered 2019-08-12: 8 [IU] via SUBCUTANEOUS
  Administered 2019-08-13 (×2): 5 [IU] via SUBCUTANEOUS
  Administered 2019-08-13: 8 [IU] via SUBCUTANEOUS
  Administered 2019-08-14: 3 [IU] via SUBCUTANEOUS
  Administered 2019-08-14: 5 [IU] via SUBCUTANEOUS
  Administered 2019-08-15: 2 [IU] via SUBCUTANEOUS

## 2019-08-11 MED ORDER — SODIUM CHLORIDE 0.9 % IV SOLN
100.0000 mg | INTRAVENOUS | Status: DC
  Filled 2019-08-11: qty 20

## 2019-08-11 MED ORDER — ONDANSETRON HCL 4 MG/2ML IJ SOLN: 4.0000 mg | Freq: Four times a day (QID) | INTRAMUSCULAR | Status: DC | PRN

## 2019-08-11 MED ORDER — ZINC SULFATE 220 (50 ZN) MG PO CAPS
220.0000 mg | ORAL_CAPSULE | Freq: Every day | ORAL | Status: DC
  Administered 2019-08-11 – 2019-08-15 (×5): 220 mg via ORAL
  Filled 2019-08-11 (×5): qty 1

## 2019-08-11 MED ORDER — ACETAMINOPHEN 325 MG PO TABS
650.0000 mg | ORAL_TABLET | Freq: Four times a day (QID) | ORAL | Status: DC | PRN
  Administered 2019-08-11 – 2019-08-12 (×2): 650 mg via ORAL
  Filled 2019-08-11: qty 2

## 2019-08-11 MED ORDER — INSULIN ASPART 100 UNIT/ML ~~LOC~~ SOLN
0.0000 [IU] | SUBCUTANEOUS | Status: DC
  Administered 2019-08-11: 8 [IU] via SUBCUTANEOUS
  Administered 2019-08-11: 3 [IU] via SUBCUTANEOUS

## 2019-08-11 MED ORDER — INSULIN DETEMIR 100 UNIT/ML ~~LOC~~ SOLN
0.0750 [IU]/kg | Freq: Two times a day (BID) | SUBCUTANEOUS | Status: DC
  Administered 2019-08-11 – 2019-08-15 (×9): 10 [IU] via SUBCUTANEOUS
  Filled 2019-08-11 (×11): qty 0.1

## 2019-08-11 MED ORDER — SODIUM CHLORIDE 0.9% FLUSH
3.0000 mL | Freq: Two times a day (BID) | INTRAVENOUS | Status: DC
  Administered 2019-08-11 – 2019-08-15 (×8): 3 mL via INTRAVENOUS

## 2019-08-11 MED ORDER — ENOXAPARIN SODIUM 40 MG/0.4ML ~~LOC~~ SOLN
40.0000 mg | Freq: Every day | SUBCUTANEOUS | Status: DC
  Administered 2019-08-11 – 2019-08-13 (×3): 40 mg via SUBCUTANEOUS
  Filled 2019-08-11 (×3): qty 0.4

## 2019-08-11 MED ORDER — PANTOPRAZOLE SODIUM 40 MG PO TBEC
40.0000 mg | DELAYED_RELEASE_TABLET | Freq: Every day | ORAL | Status: DC
  Administered 2019-08-11 – 2019-08-15 (×5): 40 mg via ORAL
  Filled 2019-08-11 (×5): qty 1

## 2019-08-11 MED ORDER — VITAMIN C 500 MG PO TABS
500.0000 mg | ORAL_TABLET | Freq: Every day | ORAL | Status: DC
  Administered 2019-08-11 – 2019-08-15 (×5): 500 mg via ORAL
  Filled 2019-08-11 (×5): qty 1

## 2019-08-11 MED ORDER — SODIUM CHLORIDE 0.9 % IV SOLN
200.0000 mg | Freq: Once | INTRAVENOUS | Status: AC
  Administered 2019-08-11: 200 mg via INTRAVENOUS
  Filled 2019-08-11: qty 40

## 2019-08-11 MED ORDER — NORGESTREL-ETHINYL ESTRADIOL 0.3-30 MG-MCG PO TABS: 1.0000 | ORAL_TABLET | Freq: Every day | ORAL | Status: DC

## 2019-08-11 MED ORDER — ONDANSETRON HCL 4 MG PO TABS: 4.0000 mg | ORAL_TABLET | Freq: Four times a day (QID) | ORAL | Status: DC | PRN

## 2019-08-11 MED ORDER — COLESEVELAM HCL 625 MG PO TABS
1250.0000 mg | ORAL_TABLET | Freq: Every day | ORAL | Status: DC
  Filled 2019-08-11 (×6): qty 2

## 2019-08-11 MED ORDER — DEXAMETHASONE 4 MG PO TABS
6.0000 mg | ORAL_TABLET | Freq: Every day | ORAL | Status: DC
  Administered 2019-08-11: 6 mg via ORAL
  Filled 2019-08-11: qty 2

## 2019-08-11 MED ORDER — IOHEXOL 350 MG/ML SOLN
125.0000 mL | Freq: Once | INTRAVENOUS | Status: AC | PRN
  Administered 2019-08-11: 125 mL via INTRAVENOUS

## 2019-08-11 MED ORDER — HYDROCOD POLST-CPM POLST ER 10-8 MG/5ML PO SUER: 5.0000 mL | Freq: Two times a day (BID) | ORAL | Status: DC | PRN

## 2019-08-11 MED ORDER — GUAIFENESIN-DM 100-10 MG/5ML PO SYRP
10.0000 mL | ORAL_SOLUTION | ORAL | Status: DC | PRN
  Administered 2019-08-11: 10 mL via ORAL
  Filled 2019-08-11: qty 10

## 2019-08-11 MED ORDER — ALBUTEROL SULFATE HFA 108 (90 BASE) MCG/ACT IN AERS
2.0000 | INHALATION_SPRAY | Freq: Four times a day (QID) | RESPIRATORY_TRACT | Status: DC
  Administered 2019-08-11 – 2019-08-15 (×16): 2 via RESPIRATORY_TRACT
  Filled 2019-08-11 (×2): qty 6.7

## 2019-08-11 MED ORDER — SODIUM CHLORIDE 0.9 % IV SOLN
Freq: Once | INTRAVENOUS | Status: AC
  Administered 2019-08-11: 08:00:00 via INTRAVENOUS

## 2019-08-11 NOTE — H&P (Signed)
History and Physical    Lauren Murphy JSE:[id number] DOB: 1981-07-27 DOA: 08/10/2019  Referring MD/NP/PA: Marily Memos, MD PCP: Jarold Motto, PA  Patient coming from: Home  Chief Complaint: Shortness of breath  I have personally briefly reviewed patient's old medical records in [facility]   HPI: Lauren Murphy is a 38 y.o. female with medical history significant of diabetes mellitus type 2 and COVID-19 positive on 11/30, who presents with complaints of progressively worsening shortness of breath over the last 3 days since being diagnosed.  Symptoms initially started 1 week ago with a productive cough.  Noted associated symptoms of fever up to 102.7 F at home, headache, body aches, night sweats, loss of smell, and loss of taste.  She had had a tele visit last week advised to alternate Tylenol and ibuprofen for fever and prescribed Symbicort.  She had obtained a pulse oximeter after she was formally diagnosed with Covid.  She was also started on prednisone 40 mg daily which she taken for 3 days prior to coming in without any change in symptoms.  Last night found that when she was up and walking around her home her oxygenation would drop into the 80s for which she came to the hospital.  Patient had previously been told that she had diabetes, but had wanted to try dietary changes and was not started on medication yet.  ED Course: Upon admission into the emergency department patient was seen to be afebrile, pulse up to 146, respirations up to 35, O2 saturations 93-97% on room air while at rest.  Labs significant for sodium 134, CO2 21, glucose 381, anion gap 13, AST 64, and ALT 66.  Inflammatory markers were not initially checked.  Chest x-ray showed streaky bibasilar atelectasis.  A CT angiogram of the chest was obtained given patient's clinical picture.  It revealed no PE, cardiomegaly without failure, and bilateral patchy infiltrates consistent with Covid.   Review of Systems  Constitutional:  Positive for diaphoresis, fever and malaise/fatigue.  HENT: Negative for congestion and ear pain.   Eyes: Negative for pain and discharge.  Respiratory: Positive for cough, sputum production and shortness of breath.   Cardiovascular: Negative for chest pain and leg swelling.  Gastrointestinal: Negative for constipation, diarrhea, nausea and vomiting.  Genitourinary: Negative for dysuria and hematuria.  Musculoskeletal: Positive for myalgias. Negative for falls.  Skin: Negative for rash.  Neurological: Positive for weakness. Negative for focal weakness.  Psychiatric/Behavioral: Negative for memory loss and substance abuse.    Past Medical History:  Diagnosis Date  . Cervical high risk HPV (human papillomavirus) test positive 2014, 2015   Colposcopy 2016 - negative.  . CIN I (cervical intraepithelial neoplasia I) 2006, 2008, 2010   colposcopic biopsies - [place]  . COVID-19   . Diabetes mellitus without complication (HCC)   . Febrile seizures (HCC)    as a child--pt.states epileptic seizures  . HSV-1 (herpes simplex virus 1) infection     Past Surgical History:  Procedure Laterality Date  . CHOLECYSTECTOMY  2003     reports that she has never smoked. She has never used smokeless tobacco. She reports previous alcohol use. She reports that she does not use drugs.  No Known Allergies  Family History  Problem Relation Age of Onset  . Diabetes Mother   . Thyroid disease Mother        hypothyroid  . Hypertension Father   . Diabetes Sister   . Diabetes Maternal Grandmother   . Stroke Maternal  Grandmother   . Hypertension Paternal Grandmother   . Stroke Paternal Grandmother   . Diabetes Paternal Grandfather   . Glaucoma Paternal Grandfather   . Diabetes Sister   . Colon cancer Neg Hx   . Stomach cancer Neg Hx   . Pancreatic cancer Neg Hx   . Pancreatic disease Neg Hx     Prior to Admission medications   Medication Sig Start Date End Date Taking? Authorizing Provider   azithromycin (ZITHROMAX) 250 MG tablet Take 250 mg by mouth daily. Take two on day 2, then one each day after    [provider]  benzonatate (TESSALON) 100 MG capsule Take by mouth 2 (two) times daily as needed for cough.    [provider]  budesonide-formoterol (SYMBICORT) 160-4.5 MCG/ACT inhaler Inhale 2 puffs into the lungs 2 (two) times daily.    [provider]  colesevelam (WELCHOL) 625 MG tablet Take 2 tablets (1,250 mg total) by mouth daily with breakfast. 03/01/19   Danis, Andreas BlowerHenry L III, MD  naproxen (NAPROSYN) 375 MG tablet Take 375 mg by mouth 2 (two) times daily with a meal.    [provider]  norgestrel-ethinyl estradiol (CRYSELLE-28) 0.3-30 MG-MCG tablet Take 1 tablet by mouth daily. 02/08/18   [provider]  nystatin (MYCOSTATIN/NYSTOP) powder Apply topically 3 (three) times daily. Apply to affected area for up to 7 days 07/25/19   Ardell IsaacsAmundson C Silva, Forrestine HimBrook E, MD  predniSONE (DELTASONE) 20 MG tablet Take 2 tablets (40 mg total) by mouth daily. 08/09/19   Jarold MottoWorley, Samantha, PA  traZODone (DESYREL) 50 MG tablet TAKE 1/2  1 TABLETS (25 50 MG TOTAL) BY MOUTH AT BEDTIME AS NEEDED FOR SLEEP. 07/13/19   [provider]    Physical Exam:  Constitutional: Middle-aged female who appears acutely sick Vitals:   08/11/19 0538 08/11/19 0600 08/11/19 0630 08/11/19 0658  BP: 138/69 126/79 123/80   Pulse: (!) 103 100    Resp: 20 (!) 34 18   Temp:      TempSrc:      SpO2: 93% 94%    Weight:    (!) 139.6 kg  Height:    5\' 6"  (1.676 m)   Eyes: PERRL, lids and conjunctivae normal ENMT: Mucous membranes are moist. Posterior pharynx clear of any exudate or lesions. Neck: normal, supple, no masses, no thyromegaly Respiratory: Tachypneic without significant wheezes or rhonchi appreciated.  On 2 L nasal cannula oxygen currently maintaining O2 saturations.  Able to talk and near complete sentences. Cardiovascular: Regular rate and rhythm, no murmurs  / rubs / gallops. No extremity edema. 2+ pedal pulses. No carotid bruits.  Abdomen: no tenderness, no masses palpated. No hepatosplenomegaly. Bowel sounds positive.  Musculoskeletal: no clubbing / cyanosis. No joint deformity upper and lower extremities. Good ROM, no contractures. Normal muscle tone.  Skin: no rashes, lesions, ulcers. No induration Neurologic: CN 2-12 grossly intact. Sensation intact, DTR normal. Strength 5/5 in all 4.  Psychiatric: Normal judgment and insight. Alert and oriented x 3. Normal mood.     Labs on Admission: I have personally reviewed following labs and imaging studies  CBC: Recent Labs  Lab 08/10/19 2226  WBC 6.0  NEUTROABS 5.0  HGB 13.2  HCT 40.2  MCV 90.3  PLT 239   Basic Metabolic Panel: Recent Labs  Lab 08/10/19 2226  NA 134*  K 3.9  CL 100  CO2 21*  GLUCOSE 381*  BUN 8  CREATININE 0.93  CALCIUM 9.0   GFR: Estimated  Creatinine Clearance: 117.2 mL/min (by C-G formula based on SCr of 0.93 mg/dL). Liver Function Tests: Recent Labs  Lab 08/10/19 2226  AST 64*  ALT 66*  ALKPHOS 57  BILITOT 0.5  PROT 7.1  ALBUMIN 3.2*   No results for input(s): LIPASE, AMYLASE in the last 168 hours. No results for input(s): AMMONIA in the last 168 hours. Coagulation Profile: No results for input(s): INR, PROTIME in the last 168 hours. Cardiac Enzymes: No results for input(s): CKTOTAL, CKMB, CKMBINDEX, TROPONINI in the last 168 hours. BNP (last 3 results) No results for input(s): PROBNP in the last 8760 hours. HbA1C: No results for input(s): HGBA1C in the last 72 hours. CBG: No results for input(s): GLUCAP in the last 168 hours. Lipid Profile: No results for input(s): CHOL, HDL, LDLCALC, TRIG, CHOLHDL, LDLDIRECT in the last 72 hours. Thyroid Function Tests: No results for input(s): TSH, T4TOTAL, FREET4, T3FREE, THYROIDAB in the last 72 hours. Anemia Panel: No results for input(s): VITAMINB12, FOLATE, FERRITIN, TIBC, IRON, RETICCTPCT in the  last 72 hours. Urine analysis:    Component Value Date/Time   COLORURINE YELLOW 08/10/2019 2225   APPEARANCEUR HAZY (A) 08/10/2019 2225   LABSPEC 1.026 08/10/2019 2225   PHURINE 6.0 08/10/2019 2225   GLUCOSEU >=500 (A) 08/10/2019 2225   HGBUR SMALL (A) 08/10/2019 2225   BILIRUBINUR NEGATIVE 08/10/2019 2225   [place] 08/10/2019 2225   PROTEINUR 30 (A) 08/10/2019 2225   NITRITE NEGATIVE 08/10/2019 2225   LEUKOCYTESUR NEGATIVE 08/10/2019 2225   Sepsis Labs: Recent Results (from the past 240 hour(s))  Novel Coronavirus, NAA ([facility])     Status: None   Collection Time: 08/08/19 12:00 AM   Specimen: Nasopharyngeal(NP) swabs in vial transport medium  Result Value Ref Range Status   SARS-CoV-2, NAA Positive  Final     Radiological Exams on Admission: Ct Angio Chest Pe W And/or Wo Contrast  Result Date: 08/11/2019 CLINICAL DATA:  Positive COVID-19 test. Shortness of breath. Productive cough. EXAM: CT ANGIOGRAPHY CHEST WITH CONTRAST TECHNIQUE: Multidetector CT imaging of the chest was performed using the standard protocol during bolus administration of intravenous contrast. Multiplanar CT image reconstructions and MIPs were obtained to evaluate the vascular anatomy. CONTRAST:  147mL OMNIPAQUE IOHEXOL 350 MG/ML SOLN COMPARISON:  One-view chest x-ray 08/10/2019 FINDINGS: Cardiovascular: Heart is enlarged. Aorta and great vessel origins are within normal limits. Pulmonary artery opacification is satisfactory. The study is mildly degraded by patient breathing motion. No significant pulmonary embolus is present. Mediastinum/Nodes: No significant mediastinal, hilar, or axillary adenopathy is present. Lungs/Pleura: Patchy bilateral airspace opacities are present, most prominent at the lung bases. Findings are consistent with COVID-19 pneumonia. There is no pneumothorax or significant pleural effusion. Upper Abdomen: The upper abdomen is within normal limits. Musculoskeletal: Vertebral body  heights and alignment are normal. No focal lytic or blastic lesions are present. The ribs are unremarkable. Review of the MIP images confirms the above findings. IMPRESSION: 1. No significant pulmonary embolus. 2. Cardiomegaly without failure. 3. Patchy bilateral airspace opacities compatible with COVID-19 pneumonia. Electronically Signed   By: San Morelle M.D.   On: 08/11/2019 06:50   Dg Chest Portable 1 View  Result Date: 08/10/2019 CLINICAL DATA:  Shortness of breath, COVID positive EXAM: PORTABLE CHEST 1 VIEW COMPARISON:  None. FINDINGS: Streaky bibasilar atelectasis. No consolidation or effusion. Normal heart size. No pneumothorax. IMPRESSION: Streaky basilar atelectasis. Electronically Signed   By: Donavan Foil M.D.   On: 08/10/2019 23:01    EKG: Independently reviewed.  Sinus  tachycardia 135 bpm  Assessment/Plan Hypoxia, pneumonia secondary to COVID-19: Acute.  Patient presents with complaints of cough, shortness of breath, fever, and myalgias.  She reported that O2 saturations dropped into the 80s with ambulation at home.  COVID-19 positive on 11/30.  CTA did not reveal any signs of pulmonary embolus, but did reveal bilateral patchy opacities consistent with COVID pneumonia. -Admit to medical telemetry bed -COVID-19 order set utilized with contact precaution -Continuous pulse oximetry with nasal cannula oxygen to maintain O2 saturations greater than 90% -Avoid NSAIDs -Check inflammatory markers stat -Albuterol inhaler -Dexamethasone 6 mg p.o. daily -Antitussives as needed -Vitamin C and zinc -Pharmacy consult for remdesivir   -Continue to monitor inflammatory markers daily  Diabetes mellitus type 2 with hyperglycemia: On admission patient presented with blood glucose elevated 381 with pseudohyponatremia of 134.  She did not have elevated anion gap.  Last hbga1c on file was 7.6 in 02/2019. She had recently been placed on steroids for treatment of Covid, but was not on any  medications for treatment of diabetes.   -Hypoglycemic protocol -Recheck hemoglobin A1c-Start Levemir 10 units twice daily -CBGs every 4 hours with moderate SSI -Adjust regimen as needed  Cardiomegaly: Incidentally noted on CTA. -May warrant echo and further work-up in outpatient setting  Hyperlipidemia -Continue colesevelam  Transaminitis: Acute.  AST 64 and ALT 66 on admission.  -Continue to monitor  DVT prophylaxis: Lovenox Code Status: Full Family Communication: No family present at bedside Disposition Plan:   Likely discharge home in 2-4 days once medically stable Consults called: None  Admission status: Inpatient   Clydie Braun MD [facility] Pager [phone]   If 7PM-7AM, please contact night-coverage [email or web address] Password [email or web address]  08/11/2019, 7:13 AM

## 2019-08-11 NOTE — Progress Notes (Signed)
Inpatient Diabetes Program Recommendations  AACE/ADA: New Consensus Statement on Inpatient Glycemic Control (2015)  Target Ranges:  Prepandial:   less than 140 mg/dL      Peak postprandial:   less than 180 mg/dL (1-2 hours)      Critically ill patients:  140 - 180 mg/dL   Lab Results  Component Value Date   GLUCAP 263 (H) 08/11/2019   HGBA1C 7.6 (H) 02/08/2019    Review of Glycemic Control Results for Lauren Murphy, Lauren Murphy (MRN [id number]) as of 08/11/2019 10:56  Ref. Range 08/11/2019 08:18  Glucose-Capillary Latest Ref Range: 70 - 99 mg/dL 263 (H)   Diabetes history: DM 2 Outpatient Diabetes medications: None Current orders for Inpatient glycemic control:  Novolog moderate q 4 hours, Levemir 10 units bid Inpatient Diabetes Program Recommendations:   Agree with current orders.  Will follow.   Thanks,  Adah Perl, RN, BC-ADM Inpatient Diabetes Coordinator Pager [phone] (8a-5p)

## 2019-08-11 NOTE — ED Notes (Signed)
Pt. Maintaining 93% on room air. Goal > 90 while awake

## 2019-08-11 NOTE — ED Notes (Signed)
Patient transported to CT 

## 2019-08-11 NOTE — ED Notes (Signed)
Pt started off 96% O2. Pt dropped to 91% while ambulating.

## 2019-08-11 NOTE — ED Provider Notes (Signed)
Emergency Department Provider Note   I have reviewed the triage vital signs and the nursing notes.   HISTORY  Chief Complaint Covid+ SOB/Fever   HPI Lauren Murphy is a 38 y.o. female with medical problems documented below that was recently diagnosed with coronavirus is a progressively worsening productive cough, fever, chills and other symptoms.  Patient states that she was ambulate at home throughout her house that her oxygen saturations were dropped to as low as 82%.  She did get very winded and dyspneic and take a long time to recover.  She states that this is suddenly worsened throughout the day today.  She noted having symptoms a week ago was tested on Friday and resulted on Monday.  No known lung problems previously.   No other associated or modifying symptoms.    Past Medical History:  Diagnosis Date  . Cervical high risk HPV (human papillomavirus) test positive 2014, 2015   Colposcopy 2016 - negative.  . CIN I (cervical intraepithelial neoplasia I) 2006, 2008, 2010   colposcopic biopsies - [place]  . COVID-19   . Diabetes mellitus without complication (HCC)   . Febrile seizures (HCC)    as a child--pt.states epileptic seizures  . HSV-1 (herpes simplex virus 1) infection     Patient Active Problem List   Diagnosis Date Noted  . Diabetes mellitus without complication (HCC) 02/09/2019  . Chronic pain of right knee 12/18/2018    Past Surgical History:  Procedure Laterality Date  . CHOLECYSTECTOMY  2003    Current Outpatient Rx  . Order #: [id number] Class: Historical Med  . Order #: [id number] Class: Historical Med  . Order #: [id number] Class: Historical Med  . Order #: [id number] Class: Normal  . Order #: [id number] Class: Historical Med  . Order #: [id number] Class: Historical Med  . Order #: [id number] Class: Normal  . Order #: [id number] Class: Normal  . Order #: [id number] Class: Historical Med    Allergies Patient has no known allergies.  Family History   Problem Relation Age of Onset  . Diabetes Mother   . Thyroid disease Mother        hypothyroid  . Hypertension Father   . Diabetes Sister   . Diabetes Maternal Grandmother   . Stroke Maternal Grandmother   . Hypertension Paternal Grandmother   . Stroke Paternal Grandmother   . Diabetes Paternal Grandfather   . Glaucoma Paternal Grandfather   . Diabetes Sister   . Colon cancer Neg Hx   . Stomach cancer Neg Hx   . Pancreatic cancer Neg Hx   . Pancreatic disease Neg Hx     Social History Social History   Tobacco Use  . Smoking status: Never Smoker  . Smokeless tobacco: Never Used  Substance Use Topics  . Alcohol use: Not Currently    Frequency: Never    Comment: rarely  . Drug use: Never    Review of Systems  All other systems negative except as documented in the HPI. All pertinent positives and negatives as reviewed in the HPI. ____________________________________________   PHYSICAL EXAM:  VITAL SIGNS: ED Triage Vitals  Enc Vitals Group     BP 08/10/19 2211 135/69     Pulse Rate 08/10/19 2211 (!) 146     Resp 08/10/19 2211 18     Temp 08/10/19 2211 98.4 F (36.9 C)     Temp Source 08/10/19 2211 Oral     SpO2 08/10/19 2211 97 %    Constitutional: Alert and oriented. Well appearing and in no acute  distress. Eyes: Conjunctivae are normal. PERRL. EOMI. Head: Atraumatic. Nose: No congestion/rhinnorhea. Mouth/Throat: Mucous membranes are moist.  Oropharynx non-erythematous. Neck: No stridor.  No meningeal signs.   Cardiovascular: tachycardic rate, regular rhythm. Good peripheral circulation. Grossly normal heart sounds.   Respiratory: tachypneic respiratory effort, not able to speak in full sentences.  No retractions. Lungs CTAB. Gastrointestinal: Soft and nontender. No distention.  Musculoskeletal: No lower extremity tenderness nor edema. No gross deformities of extremities. Neurologic:  Normal speech and language. No gross focal neurologic deficits are  appreciated.  Skin:  Skin is warm, dry and intact. No rash noted.   ____________________________________________   LABS (all labs ordered are listed, but only abnormal results are displayed)  Labs Reviewed  COMPREHENSIVE METABOLIC PANEL - Abnormal; Notable for the following components:      Result Value   Sodium 134 (*)    CO2 21 (*)    Glucose, Bld 381 (*)    Albumin 3.2 (*)    AST 64 (*)    ALT 66 (*)    All other components within normal limits  URINALYSIS, ROUTINE W REFLEX MICROSCOPIC - Abnormal; Notable for the following components:   APPearance HAZY (*)    Glucose, UA >=500 (*)    Hgb urine dipstick SMALL (*)    Protein, ur 30 (*)    Bacteria, UA RARE (*)    All other components within normal limits  CBC WITH DIFFERENTIAL/PLATELET  I-STAT BETA HCG BLOOD, ED ([facility], [facility], [facility] ONLY)   ____________________________________________  EKG   EKG Interpretation  Date/Time:  Wednesday August 10 2019 22:21:19 EST Ventricular Rate:  135 PR Interval:  136 QRS Duration: 88 QT Interval:  300 QTC Calculation: 450 R Axis:   96 Text Interpretation: Sinus tachycardia Rightward axis ST & T wave abnormality, consider inferolateral ischemia Abnormal ECG No old tracing to compare Confirmed by Merrily Pew [phone]) on 08/11/2019 1:01:21 AM       ____________________________________________  RADIOLOGY  Ct Angio Chest Pe W And/or Wo Contrast  Result Date: 08/11/2019 CLINICAL DATA:  Positive COVID-19 test. Shortness of breath. Productive cough. EXAM: CT ANGIOGRAPHY CHEST WITH CONTRAST TECHNIQUE: Multidetector CT imaging of the chest was performed using the standard protocol during bolus administration of intravenous contrast. Multiplanar CT image reconstructions and MIPs were obtained to evaluate the vascular anatomy. CONTRAST:  131mL OMNIPAQUE IOHEXOL 350 MG/ML SOLN COMPARISON:  One-view chest x-ray 08/10/2019 FINDINGS: Cardiovascular: Heart is enlarged. Aorta and great vessel origins are  within normal limits. Pulmonary artery opacification is satisfactory. The study is mildly degraded by patient breathing motion. No significant pulmonary embolus is present. Mediastinum/Nodes: No significant mediastinal, hilar, or axillary adenopathy is present. Lungs/Pleura: Patchy bilateral airspace opacities are present, most prominent at the lung bases. Findings are consistent with COVID-19 pneumonia. There is no pneumothorax or significant pleural effusion. Upper Abdomen: The upper abdomen is within normal limits. Musculoskeletal: Vertebral body heights and alignment are normal. No focal lytic or blastic lesions are present. The ribs are unremarkable. Review of the MIP images confirms the above findings. IMPRESSION: 1. No significant pulmonary embolus. 2. Cardiomegaly without failure. 3. Patchy bilateral airspace opacities compatible with COVID-19 pneumonia. Electronically Signed   By: San Morelle M.D.   On: 08/11/2019 06:50   Dg Chest Portable 1 View  Result Date: 08/10/2019 CLINICAL DATA:  Shortness of breath, COVID positive EXAM: PORTABLE CHEST 1 VIEW COMPARISON:  None. FINDINGS: Streaky bibasilar atelectasis. No consolidation or effusion. Normal heart size. No pneumothorax. IMPRESSION: Streaky basilar atelectasis.  Electronically Signed   By: Jasmine PangKim  Fujinaga M.D.   On: 08/10/2019 23:01    ____________________________________________   PROCEDURES  Procedure(s) performed:   Procedures   ____________________________________________   INITIAL IMPRESSION / ASSESSMENT AND PLAN / ED COURSE  Is a suddenly worsened today we will evaluate for pulmonary embolus as her lungs and xray are clear.  She does desat to 91% but gets very tachypneic with this.  She may end up needing admission.  Workup as above. D/w Dr. Katrinka BlazingSmith who will bring in for obs. Pt updated.      Pertinent labs & imaging results that were available during my care of the patient were reviewed by me and considered in my  medical decision making (see chart for details). ____________________________________________  FINAL CLINICAL IMPRESSION(S) / ED DIAGNOSES  Final diagnoses:  COVID-19  Hypoxia     MEDICATIONS GIVEN DURING THIS VISIT:  Medications  iohexol (OMNIPAQUE) 350 MG/ML injection 125 mL (125 mLs Intravenous Contrast Given 08/11/19 0610)     NEW OUTPATIENT MEDICATIONS STARTED DURING THIS VISIT:  New Prescriptions   No medications on file    Note:  This note was prepared with assistance of Dragon voice recognition software. Occasional wrong-word or sound-a-like substitutions may have occurred due to the inherent limitations of voice recognition software.   Iara Monds, Barbara CowerJason, MD 08/11/19 [phone]

## 2019-08-11 NOTE — ED Notes (Signed)
Pt oxygen saturations noted to be 90% on room air. Placed pt on 2L [place] with improvement to 94%. Admitting MD notified. No new orders at this time.

## 2019-08-12 ENCOUNTER — Encounter (HOSPITAL_COMMUNITY): Payer: Self-pay

## 2019-08-12 LAB — CBC WITH DIFFERENTIAL/PLATELET
Abs Immature Granulocytes: 0.01 10*3/uL (ref 0.00–0.07)
Basophils Absolute: 0 10*3/uL (ref 0.0–0.1)
Basophils Relative: 0 %
Eosinophils Absolute: 0 10*3/uL (ref 0.0–0.5)
Eosinophils Relative: 0 %
HCT: 36.5 % (ref 36.0–46.0)
Hemoglobin: 11.9 g/dL — ABNORMAL LOW (ref 12.0–15.0)
Immature Granulocytes: 0 %
Lymphocytes Relative: 29 %
Lymphs Abs: 0.9 10*3/uL (ref 0.7–4.0)
MCH: 29.8 pg (ref 26.0–34.0)
MCHC: 32.6 g/dL (ref 30.0–36.0)
MCV: 91.3 fL (ref 80.0–100.0)
Monocytes Absolute: 0.3 10*3/uL (ref 0.1–1.0)
Monocytes Relative: 10 %
Neutro Abs: 1.8 10*3/uL (ref 1.7–7.7)
Neutrophils Relative %: 61 %
Platelets: 236 10*3/uL (ref 150–400)
RBC: 4 MIL/uL (ref 3.87–5.11)
RDW: 12.7 % (ref 11.5–15.5)
WBC: 3 10*3/uL — ABNORMAL LOW (ref 4.0–10.5)
nRBC: 0 % (ref 0.0–0.2)

## 2019-08-12 LAB — COMPREHENSIVE METABOLIC PANEL
ALT: 78 U/L — ABNORMAL HIGH (ref 0–44)
AST: 63 U/L — ABNORMAL HIGH (ref 15–41)
Albumin: 2.9 g/dL — ABNORMAL LOW (ref 3.5–5.0)
Alkaline Phosphatase: 42 U/L (ref 38–126)
Anion gap: 13 (ref 5–15)
BUN: 10 mg/dL (ref 6–20)
CO2: 23 mmol/L (ref 22–32)
Calcium: 8.2 mg/dL — ABNORMAL LOW (ref 8.9–10.3)
Chloride: 100 mmol/L (ref 98–111)
Creatinine, Ser: 0.82 mg/dL (ref 0.44–1.00)
GFR calc Af Amer: >60 mL/min (ref >60)
GFR calc non Af Amer: >60 mL/min (ref >60)
Glucose, Bld: 294 mg/dL — ABNORMAL HIGH (ref 70–99)
Potassium: 3.8 mmol/L (ref 3.5–5.1)
Sodium: 136 mmol/L (ref 135–145)
Total Bilirubin: 0.5 mg/dL (ref 0.3–1.2)
Total Protein: 6.7 g/dL (ref 6.5–8.1)

## 2019-08-12 LAB — GLUCOSE, CAPILLARY
Glucose-Capillary: 228 mg/dL — ABNORMAL HIGH (ref 70–99)
Glucose-Capillary: 252 mg/dL — ABNORMAL HIGH (ref 70–99)

## 2019-08-12 LAB — CBG MONITORING, ED
Glucose-Capillary: 270 mg/dL — ABNORMAL HIGH (ref 70–99)
Glucose-Capillary: 230 mg/dL — ABNORMAL HIGH (ref 70–99)

## 2019-08-12 LAB — MAGNESIUM: Magnesium: 1.7 mg/dL (ref 1.7–2.4)

## 2019-08-12 LAB — PHOSPHORUS: Phosphorus: 3.7 mg/dL (ref 2.5–4.6)

## 2019-08-12 LAB — C-REACTIVE PROTEIN: CRP: 4.2 mg/dL — ABNORMAL HIGH (ref <1.0)

## 2019-08-12 LAB — D-DIMER, QUANTITATIVE: D-Dimer, Quant: 0.56 ug/mL-FEU — ABNORMAL HIGH (ref 0.00–0.50)

## 2019-08-12 LAB — FERRITIN: Ferritin: 473 ng/mL — ABNORMAL HIGH (ref 11–307)

## 2019-08-12 MED ORDER — SODIUM CHLORIDE 0.9 % IV SOLN
100.0000 mg | Freq: Every day | INTRAVENOUS | Status: AC
  Administered 2019-08-12 – 2019-08-15 (×4): 100 mg via INTRAVENOUS
  Filled 2019-08-12 (×5): qty 20

## 2019-08-12 MED ORDER — DEXAMETHASONE 6 MG PO TABS
6.0000 mg | ORAL_TABLET | Freq: Every day | ORAL | Status: DC
  Administered 2019-08-12: 6 mg via ORAL
  Filled 2019-08-12: qty 2

## 2019-08-12 MED ORDER — DEXAMETHASONE SODIUM PHOSPHATE 10 MG/ML IJ SOLN: 6.0000 mg | INTRAMUSCULAR | Status: DC

## 2019-08-12 NOTE — ED Notes (Signed)
Lunch Tray Ordered @ 1020.  

## 2019-08-12 NOTE — Progress Notes (Signed)
PROGRESS NOTE    Lauren Murphy  IOE:[id number] DOB: 06-15-1981 DOA: 08/10/2019 PCP: Jarold Motto, PA   Brief Narrative: 38 year old with past medical history significant for diabetes type 2, diagnosed with Covid 19 on 11/30 who presents complaining of progressive shortness of breath over the last 3 days since diagnosis.  Patient also report of fever temperature 102, headache, body aches, night sweat, loss of smell.  She was taking prednisone after she was diagnosed with Covid.  She checked her oxygen saturation on ambulation at home and it dropped to 80, she presented to the ED for further evaluation.  Patient was found to be tachycardic heart rate 146, tachypneic respiration rate 35, oxygen saturation 93 on room air while at rest.  Mild transaminases.  CT angio chest was negative for PE showed bilateral patchy infiltrates.    Assessment & Plan:   Principal Problem:   Pneumonia due to COVID-19 virus Active Problems:   Hypoxia   Uncontrolled type 2 diabetes mellitus with hyperglycemia (HCC)   Cardiomegaly   Hyperlipidemia   Transaminitis  1-Acute Hypoxic Respiratory Failure, secondary to COVID-19 pneumonia: Oxygen saturation was 89 on room air. CT angio negative for PE. Did showed infiltrates.  Patient was a started on dexamethasone daily. Continue with Remdesivir day 2.  Continue with Albuterol schedule. Continue with Vitamin C and Zinc.   Continue with PRN Robitussin.  COVID-19 Labs  Recent Labs    08/11/19 0820 08/12/19 0435  DDIMER 0.72* 0.56*  FERRITIN 487* 473*  LDH 267*  --   CRP 2.2* 4.2*    Lab Results  Component Value Date   SARSCOV2NAA Positive 08/08/2019     2-Diabetes Type with hyperglycemia. Poorly controlled Hb A1 at 7.6 Levemir BID SSI  Cardiomegaly; incidentally on CT. Work up out patient.   Hyperlipidemia; on colesevelam, continue.   Transaminases; monitor. Related to viral illness.   Others; would hold Norgestrel-stradiol for now. Resume  at discharge.   Estimated body mass index is 49.68 kg/m as calculated from the following:   Height as of this encounter: 5\' 6"  (1.676 m).   Weight as of this encounter: 139.6 kg.   DVT prophylaxis: Lovenox Code Status: Full code Family Communication:care discussed with patient.  Disposition Plan: remain in the hospital for treatment of acute hypoxic respiratory failure secondary to Covid 19 pneumonia patient will require IV Remdesivir.   Consultants:   none  Procedures:   none  Antimicrobials/Others:  Remdesivir 12-03.  Subjective: She report chest pain with deep breath,. Report SOB and cough   Objective: Vitals:   08/11/19 2331 08/12/19 0600 08/12/19 1355 08/12/19 1356  BP: 134/65 129/72  98/86  Pulse: (!) 104 96  92  Resp: (!) 24 (!) 25  18  Temp:      TempSrc:      SpO2: 97% 92% 94% 92%  Weight:      Height:        Intake/Output Summary (Last 24 hours) at 08/12/2019 1445 Last data filed at 08/12/2019 1103 Gross per 24 hour  Intake 100 ml  Output -  Net 100 ml   Filed Weights   08/11/19 0658  Weight: (!) 139.6 kg    Examination:  General exam: Appears calm and comfortable  Respiratory system: Decreased breath sounds. 14/03/20 Respiratory effort normal. Cardiovascular system: S1 & S2 heard, RRR. No JVD, murmurs, rubs, gallops or clicks. No pedal edema. Gastrointestinal system: Abdomen is nondistended, soft and nontender. No organomegaly or masses felt. Normal bowel sounds heard. Central nervous  system: Alert and oriented. No focal neurological deficits. Extremities: Symmetric 5 x 5 power. Skin: No rashes, lesions or ulcers   Data Reviewed: I have personally reviewed following labs and imaging studies  CBC: Recent Labs  Lab 08/10/19 2226 08/12/19 0435  WBC 6.0 3.0*  NEUTROABS 5.0 1.8  HGB 13.2 11.9*  HCT 40.2 36.5  MCV 90.3 91.3  PLT 239 236   Basic Metabolic Panel: Recent Labs  Lab 08/10/19 2226 08/12/19 0435  NA 134* 136  K 3.9 3.8  CL 100  100  CO2 21* 23  GLUCOSE 381* 294*  BUN 8 10  CREATININE 0.93 0.82  CALCIUM 9.0 8.2*  MG  --  1.7  PHOS  --  3.7   GFR: Estimated Creatinine Clearance: 132.9 mL/min (by C-G formula based on SCr of 0.82 mg/dL). Liver Function Tests: Recent Labs  Lab 08/10/19 2226 08/12/19 0435  AST 64* 63*  ALT 66* 78*  ALKPHOS 57 42  BILITOT 0.5 0.5  PROT 7.1 6.7  ALBUMIN 3.2* 2.9*   No results for input(s): LIPASE, AMYLASE in the last 168 hours. No results for input(s): AMMONIA in the last 168 hours. Coagulation Profile: No results for input(s): INR, PROTIME in the last 168 hours. Cardiac Enzymes: No results for input(s): CKTOTAL, CKMB, CKMBINDEX, TROPONINI in the last 168 hours. BNP (last 3 results) No results for input(s): PROBNP in the last 8760 hours. HbA1C: Recent Labs    08/10/19 2226  HGBA1C 7.7*   CBG: Recent Labs  Lab 08/11/19 0818 08/11/19 1314 08/11/19 2337 08/12/19 0753 08/12/19 1326  GLUCAP 263* 179* 279* 270* 230*   Lipid Profile: No results for input(s): CHOL, HDL, LDLCALC, TRIG, CHOLHDL, LDLDIRECT in the last 72 hours. Thyroid Function Tests: No results for input(s): TSH, T4TOTAL, FREET4, T3FREE, THYROIDAB in the last 72 hours. Anemia Panel: Recent Labs    08/11/19 0820 08/12/19 0435  FERRITIN 487* 473*   Sepsis Labs: Recent Labs  Lab 08/11/19 0820  PROCALCITON <0.10    Recent Results (from the past 240 hour(s))  Novel Coronavirus, NAA ([facility])     Status: None   Collection Time: 08/08/19 12:00 AM   Specimen: Nasopharyngeal(NP) swabs in vial transport medium  Result Value Ref Range Status   SARS-CoV-2, NAA Positive  Final         Radiology Studies: Ct Angio Chest Pe W And/or Wo Contrast  Result Date: 08/11/2019 CLINICAL DATA:  Positive COVID-19 test. Shortness of breath. Productive cough. EXAM: CT ANGIOGRAPHY CHEST WITH CONTRAST TECHNIQUE: Multidetector CT imaging of the chest was performed using the standard protocol during bolus  administration of intravenous contrast. Multiplanar CT image reconstructions and MIPs were obtained to evaluate the vascular anatomy. CONTRAST:  125mL OMNIPAQUE IOHEXOL 350 MG/ML SOLN COMPARISON:  One-view chest x-ray 08/10/2019 FINDINGS: Cardiovascular: Heart is enlarged. Aorta and great vessel origins are within normal limits. Pulmonary artery opacification is satisfactory. The study is mildly degraded by patient breathing motion. No significant pulmonary embolus is present. Mediastinum/Nodes: No significant mediastinal, hilar, or axillary adenopathy is present. Lungs/Pleura: Patchy bilateral airspace opacities are present, most prominent at the lung bases. Findings are consistent with COVID-19 pneumonia. There is no pneumothorax or significant pleural effusion. Upper Abdomen: The upper abdomen is within normal limits. Musculoskeletal: Vertebral body heights and alignment are normal. No focal lytic or blastic lesions are present. The ribs are unremarkable. Review of the MIP images confirms the above findings. IMPRESSION: 1. No significant pulmonary embolus. 2. Cardiomegaly without failure. 3. Patchy bilateral airspace  opacities compatible with COVID-19 pneumonia. Electronically Signed   By: San Morelle M.D.   On: 08/11/2019 06:50   Dg Chest Portable 1 View  Result Date: 08/10/2019 CLINICAL DATA:  Shortness of breath, COVID positive EXAM: PORTABLE CHEST 1 VIEW COMPARISON:  None. FINDINGS: Streaky bibasilar atelectasis. No consolidation or effusion. Normal heart size. No pneumothorax. IMPRESSION: Streaky basilar atelectasis. Electronically Signed   By: Donavan Foil M.D.   On: 08/10/2019 23:01        Scheduled Meds: . albuterol  2 puff Inhalation Q6H  . colesevelam  1,250 mg Oral Q breakfast  . dexamethasone  6 mg Oral Daily  . enoxaparin (LOVENOX) injection  40 mg Subcutaneous Daily  . insulin aspart  0-15 Units Subcutaneous TID WC  . insulin detemir  0.075 Units/kg Subcutaneous BID  .  norgestrel-ethinyl estradiol  1 tablet Oral Daily  . pantoprazole  40 mg Oral Daily  . sodium chloride flush  3 mL Intravenous Q12H  . vitamin C  500 mg Oral Daily  . zinc sulfate  220 mg Oral Daily   Continuous Infusions: . remdesivir 100 mg in NS 100 mL Stopped (08/12/19 1103)     LOS: 1 day    Time spent: 35 minutes.     Elmarie Shiley, MD [facility]   If 7PM-7AM, please contact night-coverage [email or web address] Password [email or web address] 08/12/2019, 2:45 PM

## 2019-08-12 NOTE — ED Notes (Addendum)
ED TO INPATIENT HANDOFF REPORT  ED Nurse Name and Phone #: Alycia Rossetti [phone]  S Name/Age/Gender Verdis Frederickson 38 y.o. female Room/Bed: 012C/012C  Code Status   Code Status: Full Code  Home/SNF/Other Home Patient oriented to: self, place, time and situation Is this baseline? Yes   Triage Complete: Triage complete  Chief Complaint SOB  Triage Note Patient tested positive for Covid 19 last week , reports persistent  SOB with productive cough , fever, chills , loss of taste and smell.    Allergies No Known Allergies  Level of Care/Admitting Diagnosis ED Disposition    ED Disposition Condition Comment   Admit  Hospital Area: [facility] [[id number]]  Level of Care: Telemetry [5]  Covid Evaluation: Confirmed COVID Positive  Diagnosis: Pneumonia due to COVID-19 virus [[id number]]  Admitting Physician: Alba Cory [phone]  Attending Physician: Alba Cory [phone]  Estimated length of stay: 3 - 4 days  Certification:: I certify this patient will need inpatient services for at least 2 midnights  PT Class (Do Not Modify): Inpatient [101]  PT Acc Code (Do Not Modify): Private [1]       B Medical/Surgery History Past Medical History:  Diagnosis Date  . Cervical high risk HPV (human papillomavirus) test positive 2014, 2015   Colposcopy 2016 - negative.  . CIN I (cervical intraepithelial neoplasia I) 2006, 2008, 2010   colposcopic biopsies - [place]  . COVID-19   . Diabetes mellitus without complication (HCC)   . Febrile seizures (HCC)    as a child--pt.states epileptic seizures  . HSV-1 (herpes simplex virus 1) infection    Past Surgical History:  Procedure Laterality Date  . CHOLECYSTECTOMY  2003     A IV Location/Drains/Wounds Patient Lines/Drains/Airways Status   Active Line/Drains/Airways    Name:   Placement date:   Placement time:   Site:   Days:   Peripheral IV 08/11/19 Right Antecubital   08/11/19    0413    Antecubital   1           Intake/Output Last 24 hours  Intake/Output Summary (Last 24 hours) at 08/12/2019 1104 Last data filed at 08/12/2019 1103 Gross per 24 hour  Intake 100 ml  Output -  Net 100 ml    Labs/Imaging Results for orders placed or performed during the hospital encounter of 08/10/19 (from the past 48 hour(s))  Urinalysis, Routine w reflex microscopic     Status: Abnormal   Collection Time: 08/10/19 10:25 PM  Result Value Ref Range   Color, Urine YELLOW YELLOW   APPearance HAZY (A) CLEAR   Specific Gravity, Urine 1.026 1.005 - 1.030   pH 6.0 5.0 - 8.0   Glucose, UA >=500 (A) NEGATIVE mg/dL   Hgb urine dipstick SMALL (A) NEGATIVE   Bilirubin Urine NEGATIVE NEGATIVE   Ketones, ur NEGATIVE NEGATIVE mg/dL   Protein, ur 30 (A) NEGATIVE mg/dL   Nitrite NEGATIVE NEGATIVE   Leukocytes,Ua NEGATIVE NEGATIVE   RBC / HPF 0-5 0 - 5 RBC/hpf   WBC, UA 0-5 0 - 5 WBC/hpf   Bacteria, UA RARE (A) NONE SEEN   Squamous Epithelial / LPF 6-10 0 - 5   Mucus PRESENT     Comment: Performed at [facility], [address], [place], [place] [address]  CBC with Differential     Status: None   Collection Time: 08/10/19 10:26 PM  Result Value Ref Range   WBC 6.0 4.0 - 10.5 K/uL  RBC 4.45 3.87 - 5.11 MIL/uL   Hemoglobin 13.2 12.0 - 15.0 g/dL   HCT 62.9 52.8 - 41.3 %   MCV 90.3 80.0 - 100.0 fL   MCH 29.7 26.0 - 34.0 pg   MCHC 32.8 30.0 - 36.0 g/dL   RDW 24.4 01.0 - 27.2 %   Platelets 239 150 - 400 K/uL   nRBC 0.0 0.0 - 0.2 %   Neutrophils Relative % 83 %   Neutro Abs 5.0 1.7 - 7.7 K/uL   Lymphocytes Relative 14 %   Lymphs Abs 0.8 0.7 - 4.0 K/uL   Monocytes Relative 3 %   Monocytes Absolute 0.2 0.1 - 1.0 K/uL   Eosinophils Relative 0 %   Eosinophils Absolute 0.0 0.0 - 0.5 K/uL   Basophils Relative 0 %   Basophils Absolute 0.0 0.0 - 0.1 K/uL   Immature Granulocytes 0 %   Abs Immature Granulocytes 0.02 0.00 - 0.07 K/uL    Comment: Performed at [facility], [address],  [place], [place] [address]  Comprehensive metabolic panel     Status: Abnormal   Collection Time: 08/10/19 10:26 PM  Result Value Ref Range   Sodium 134 (L) 135 - 145 mmol/L   Potassium 3.9 3.5 - 5.1 mmol/L   Chloride 100 98 - 111 mmol/L   CO2 21 (L) 22 - 32 mmol/L   Glucose, Bld 381 (H) 70 - 99 mg/dL   BUN 8 6 - 20 mg/dL   Creatinine, Ser 4.03 0.44 - 1.00 mg/dL   Calcium 9.0 8.9 - 47.4 mg/dL   Total Protein 7.1 6.5 - 8.1 g/dL   Albumin 3.2 (L) 3.5 - 5.0 g/dL   AST 64 (H) 15 - 41 U/L   ALT 66 (H) 0 - 44 U/L   Alkaline Phosphatase 57 38 - 126 U/L   Total Bilirubin 0.5 0.3 - 1.2 mg/dL   GFR calc non Af Amer >60 >60 mL/min   GFR calc Af Amer >60 >60 mL/min   Anion gap 13 5 - 15    Comment: Performed at [facility], [address] [place], [place] [address]  Hemoglobin A1c     Status: Abnormal   Collection Time: 08/10/19 10:26 PM  Result Value Ref Range   Hgb A1c MFr Bld 7.7 (H) 4.8 - 5.6 %    Comment: (NOTE) Pre diabetes:          5.7%-6.4% Diabetes:              >6.4% Glycemic control for   <7.0% adults with diabetes    Mean Plasma Glucose 174.29 mg/dL    Comment: Performed at [facility], [address], [place], [place] [address]  I-Stat Beta hCG blood, ED ([facility], [facility], [facility] only)     Status: None   Collection Time: 08/10/19 10:33 PM  Result Value Ref Range   I-stat hCG, quantitative <5.0 <5 mIU/mL   Comment 3            Comment:   GEST. AGE      CONC.  (mIU/mL)   <=1 WEEK        5 - 50     2 WEEKS       50 - 500     3 WEEKS       100 - 10,000     4 WEEKS     1,000 - 30,000        FEMALE AND NON-PREGNANT FEMALE:  LESS THAN 5 mIU/mL   CBG monitoring, ED     Status: Abnormal   Collection Time: 08/11/19  8:18 AM  Result Value Ref Range   Glucose-Capillary 263 (H) 70 - 99 mg/dL  ABO/Rh     Status: None   Collection Time: 08/11/19  8:19 AM  Result Value Ref Range   ABO/RH(D)      B POS Performed at [facility], [address] [place], [place] [address]    Type and screen [facility]     Status: None   Collection Time: 08/11/19  8:19 AM  Result Value Ref Range   ABO/RH(D) B POS    Antibody Screen NEG    Sample Expiration      08/14/2019,2359 Performed at [facility], [address], [place], [place] [address]   Brain natriuretic peptide     Status: None   Collection Time: 08/11/19  8:19 AM  Result Value Ref Range   B Natriuretic Peptide 13.9 0.0 - 100.0 pg/mL    Comment: Performed at [facility], [address], [place], [place] [address]  Hepatitis B surface antigen     Status: None   Collection Time: 08/11/19  8:19 AM  Result Value Ref Range   Hepatitis B Surface Ag NON REACTIVE NON REACTIVE    Comment: Performed at [facility], [address], [place], [place] [address]  HIV Antibody (routine testing w rflx)     Status: None   Collection Time: 08/11/19  8:20 AM  Result Value Ref Range   HIV Screen 4th Generation wRfx NON REACTIVE NON REACTIVE    Comment: Performed at [facility], [address] [place], [place] [address]  C-reactive protein     Status: Abnormal   Collection Time: 08/11/19  8:20 AM  Result Value Ref Range   CRP 2.2 (H) <1.0 mg/dL    Comment: Performed at [facility], [address], [place], [place] [address]  D-dimer, quantitative (not at [facility])     Status: Abnormal   Collection Time: 08/11/19  8:20 AM  Result Value Ref Range   D-Dimer, Quant 0.72 (H) 0.00 - 0.50 ug/mL-FEU    Comment: (NOTE) At the manufacturer cut-off of 0.50 ug/mL FEU, this assay has been documented to exclude PE with a sensitivity and negative predictive value of 97 to 99%.  At this time, this assay has not been approved by the FDA to exclude DVT/VTE. Results should be correlated with clinical presentation. Performed at [facility], [address], [place], [place] [address]   Ferritin     Status: Abnormal   Collection Time: 08/11/19  8:20 AM  Result Value Ref Range    Ferritin 487 (H) 11 - 307 ng/mL    Comment: Performed at [facility], [address], [place], [place] [address]  Fibrinogen     Status: None   Collection Time: 08/11/19  8:20 AM  Result Value Ref Range   Fibrinogen 437 210 - 475 mg/dL    Comment: Performed at [facility], [address], [place], [place] [address]  Lactate dehydrogenase     Status: Abnormal   Collection Time: 08/11/19  8:20 AM  Result Value Ref Range   LDH 267 (H) 98 - 192 U/L    Comment: Performed at [facility], [address] [place], [place] [address]  Procalcitonin     Status:  None   Collection Time: 08/11/19  8:20 AM  Result Value Ref Range   Procalcitonin <0.10 ng/mL    Comment:        Interpretation: PCT (Procalcitonin) <= 0.5 ng/mL: Systemic infection (sepsis) is not likely. Local bacterial infection is possible. (NOTE)       Sepsis PCT Algorithm           Lower Respiratory Tract                                      Infection PCT Algorithm    ----------------------------     ----------------------------         PCT < 0.25 ng/mL                PCT < 0.10 ng/mL         Strongly encourage             Strongly discourage   discontinuation of antibiotics    initiation of antibiotics    ----------------------------     -----------------------------       PCT 0.25 - 0.50 ng/mL            PCT 0.10 - 0.25 ng/mL               OR       >80% decrease in PCT            Discourage initiation of                                            antibiotics      Encourage discontinuation           of antibiotics    ----------------------------     -----------------------------         PCT >= 0.50 ng/mL              PCT 0.26 - 0.50 ng/mL               AND        <80% decrease in PCT             Encourage initiation of                                             antibiotics       Encourage continuation           of antibiotics    ----------------------------     -----------------------------         PCT >= 0.50 ng/mL                  PCT > 0.50 ng/mL               AND         increase in PCT                  Strongly encourage                                      initiation of antibiotics    Strongly encourage  escalation           of antibiotics                                     -----------------------------                                           PCT <= 0.25 ng/mL                                                 OR                                        > 80% decrease in PCT                                     Discontinue / Do not initiate                                             antibiotics Performed at [facility], [place] [address], [place], [place] [address]   Troponin I (High Sensitivity)     Status: None   Collection Time: 08/11/19  8:20 AM  Result Value Ref Range   Troponin I (High Sensitivity) 4 <18 ng/L    Comment: (NOTE) Elevated high sensitivity troponin I (hsTnI) values and significant  changes across serial measurements may suggest ACS but many other  chronic and acute conditions are known to elevate hsTnI results.  Refer to the "Links" section for chest pain algorithms and additional  guidance. Performed at [facility], [place] [address], [place], [place] [address]   Troponin I (High Sensitivity)     Status: None   Collection Time: 08/11/19  9:45 AM  Result Value Ref Range   Troponin I (High Sensitivity) 5 <18 ng/L    Comment: (NOTE) Elevated high sensitivity troponin I (hsTnI) values and significant  changes across serial measurements may suggest ACS but many other  chronic and acute conditions are known to elevate hsTnI results.  Refer to the "Links" section for chest pain algorithms and additional  guidance. Performed at [facility], [place] [address] [place],  [address]   CBG monitoring, ED     Status: Abnormal   Collection Time: 08/11/19  1:14 PM  Result Value Ref Range   Glucose-Capillary 179 (H) 70 - 99 mg/dL  CBG  monitoring, ED     Status: Abnormal   Collection Time: 08/11/19 11:37 PM  Result Value Ref Range   Glucose-Capillary 279 (H) 70 - 99 mg/dL  CBC with Differential/Platelet     Status: Abnormal   Collection Time: 08/12/19  4:35 AM  Result Value Ref Range   WBC 3.0 (L) 4.0 - 10.5 K/uL   RBC 4.00 3.87 - 5.11 MIL/uL   Hemoglobin 11.9 (L) 12.0 - 15.0 g/dL   HCT 36.5  36.0 - 46.0 %   MCV 91.3 80.0 - 100.0 fL   MCH 29.8 26.0 - 34.0 pg   MCHC 32.6 30.0 - 36.0 g/dL   RDW 34.7 42.5 - 95.6 %   Platelets 236 150 - 400 K/uL   nRBC 0.0 0.0 - 0.2 %   Neutrophils Relative % 61 %   Neutro Abs 1.8 1.7 - 7.7 K/uL   Lymphocytes Relative 29 %   Lymphs Abs 0.9 0.7 - 4.0 K/uL   Monocytes Relative 10 %   Monocytes Absolute 0.3 0.1 - 1.0 K/uL   Eosinophils Relative 0 %   Eosinophils Absolute 0.0 0.0 - 0.5 K/uL   Basophils Relative 0 %   Basophils Absolute 0.0 0.0 - 0.1 K/uL   Immature Granulocytes 0 %   Abs Immature Granulocytes 0.01 0.00 - 0.07 K/uL    Comment: Performed at [facility], [address] [place], [place] [address]  Comprehensive metabolic panel     Status: Abnormal   Collection Time: 08/12/19  4:35 AM  Result Value Ref Range   Sodium 136 135 - 145 mmol/L   Potassium 3.8 3.5 - 5.1 mmol/L   Chloride 100 98 - 111 mmol/L   CO2 23 22 - 32 mmol/L   Glucose, Bld 294 (H) 70 - 99 mg/dL   BUN 10 6 - 20 mg/dL   Creatinine, Ser 4.33 0.44 - 1.00 mg/dL   Calcium 8.2 (L) 8.9 - 10.3 mg/dL   Total Protein 6.7 6.5 - 8.1 g/dL   Albumin 2.9 (L) 3.5 - 5.0 g/dL   AST 63 (H) 15 - 41 U/L   ALT 78 (H) 0 - 44 U/L   Alkaline Phosphatase 42 38 - 126 U/L   Total Bilirubin 0.5 0.3 - 1.2 mg/dL   GFR calc non Af Amer >60 >60 mL/min   GFR calc Af Amer >60 >60 mL/min   Anion gap 13 5 - 15    Comment: Performed at [facility], [address], [place], [place] [address]  C-reactive protein     Status: Abnormal   Collection Time: 08/12/19  4:35 AM  Result Value Ref Range   CRP 4.2 (H) <1.0 mg/dL     Comment: Performed at [facility], [address], [place], [place] [address]  D-dimer, quantitative (not at [facility])     Status: Abnormal   Collection Time: 08/12/19  4:35 AM  Result Value Ref Range   D-Dimer, Quant 0.56 (H) 0.00 - 0.50 ug/mL-FEU    Comment: (NOTE) At the manufacturer cut-off of 0.50 ug/mL FEU, this assay has been documented to exclude PE with a sensitivity and negative predictive value of 97 to 99%.  At this time, this assay has not been approved by the FDA to exclude DVT/VTE. Results should be correlated with clinical presentation. Performed at [facility], [address] [place], [place] [address]   Ferritin     Status: Abnormal   Collection Time: 08/12/19  4:35 AM  Result Value Ref Range   Ferritin 473 (H) 11 - 307 ng/mL    Comment: Performed at [facility], [address], [place], [place] [address]  Magnesium     Status: None   Collection Time: 08/12/19  4:35 AM  Result Value Ref Range   Magnesium 1.7 1.7 - 2.4 mg/dL    Comment: Performed at [facility], [address], [place], [place] [address]  Phosphorus  Status: None   Collection Time: 08/12/19  4:35 AM  Result Value Ref Range   Phosphorus 3.7 2.5 - 4.6 mg/dL    Comment: Performed at [facility], [address] [place], [place] [id number]  CBG monitoring, ED     Status: Abnormal   Collection Time: 08/12/19  7:53 AM  Result Value Ref Range   Glucose-Capillary 270 (H) 70 - 99 mg/dL   Comment 1 Notify RN    Comment 2 Document in Chart    Ct Angio Chest Pe W And/or Wo Contrast  Result Date: 08/11/2019 CLINICAL DATA:  Positive COVID-19 test. Shortness of breath. Productive cough. EXAM: CT ANGIOGRAPHY CHEST WITH CONTRAST TECHNIQUE: Multidetector CT imaging of the chest was performed using the standard protocol during bolus administration of intravenous contrast. Multiplanar CT image reconstructions and MIPs were obtained to evaluate the vascular anatomy. CONTRAST:   125mL OMNIPAQUE IOHEXOL 350 MG/ML SOLN COMPARISON:  One-view chest x-ray 08/10/2019 FINDINGS: Cardiovascular: Heart is enlarged. Aorta and great vessel origins are within normal limits. Pulmonary artery opacification is satisfactory. The study is mildly degraded by patient breathing motion. No significant pulmonary embolus is present. Mediastinum/Nodes: No significant mediastinal, hilar, or axillary adenopathy is present. Lungs/Pleura: Patchy bilateral airspace opacities are present, most prominent at the lung bases. Findings are consistent with COVID-19 pneumonia. There is no pneumothorax or significant pleural effusion. Upper Abdomen: The upper abdomen is within normal limits. Musculoskeletal: Vertebral body heights and alignment are normal. No focal lytic or blastic lesions are present. The ribs are unremarkable. Review of the MIP images confirms the above findings. IMPRESSION: 1. No significant pulmonary embolus. 2. Cardiomegaly without failure. 3. Patchy bilateral airspace opacities compatible with COVID-19 pneumonia. Electronically Signed   By: Marin Robertshristopher  Mattern M.D.   On: 08/11/2019 06:50   Dg Chest Portable 1 View  Result Date: 08/10/2019 CLINICAL DATA:  Shortness of breath, COVID positive EXAM: PORTABLE CHEST 1 VIEW COMPARISON:  None. FINDINGS: Streaky bibasilar atelectasis. No consolidation or effusion. Normal heart size. No pneumothorax. IMPRESSION: Streaky basilar atelectasis. Electronically Signed   By: Jasmine PangKim  Fujinaga M.D.   On: 08/10/2019 23:01    Pending Labs Unresulted Labs (From admission, onward)    Start     Ordered   08/12/19 0500  CBC with Differential/Platelet  Daily,   R     08/11/19 0732   08/12/19 0500  Comprehensive metabolic panel  Daily,   R     08/11/19 0732   08/12/19 0500  C-reactive protein  Daily,   R     08/11/19 0732   08/12/19 0500  D-dimer, quantitative (not at [facility])  Daily,   R     08/11/19 0732   08/12/19 0500  Ferritin  Daily,   R     08/11/19 0732    08/12/19 0500  Magnesium  Daily,   R     08/11/19 0732   08/12/19 0500  Phosphorus  Daily,   R     08/11/19 0732   08/11/19 0729  Culture, sputum-assessment  Once,   R     08/11/19 0732          Vitals/Pain Today's Vitals   08/11/19 2331 08/11/19 2333 08/11/19 2341 08/12/19 0600  BP: 134/65   129/72  Pulse: (!) 104   96  Resp: (!) 24   (!) 25  Temp:      TempSrc:      SpO2: 97%   92%  Weight:      Height:  PainSc:  8  8      Isolation Precautions Airborne and Contact precautions  Medications Medications  enoxaparin (LOVENOX) injection 40 mg (40 mg Subcutaneous Given 08/12/19 1010)  sodium chloride flush (NS) 0.9 % injection 3 mL (3 mLs Intravenous Given 08/11/19 2338)  albuterol (VENTOLIN HFA) 108 (90 Base) MCG/ACT inhaler 2 puff (2 puffs Inhalation Given 08/12/19 0820)  guaiFENesin-dextromethorphan (ROBITUSSIN DM) 100-10 MG/5ML syrup 10 mL (10 mLs Oral Given 08/11/19 0829)  chlorpheniramine-HYDROcodone (TUSSIONEX) 10-8 MG/5ML suspension 5 mL (has no administration in time range)  vitamin C (ASCORBIC ACID) tablet 500 mg (500 mg Oral Given 08/12/19 1003)  zinc sulfate capsule 220 mg (220 mg Oral Given 08/12/19 1004)  pantoprazole (PROTONIX) EC tablet 40 mg (40 mg Oral Given 08/12/19 1004)  acetaminophen (TYLENOL) tablet 650 mg (650 mg Oral Given 08/11/19 0829)  ondansetron (ZOFRAN) tablet 4 mg (has no administration in time range)    Or  ondansetron (ZOFRAN) injection 4 mg (has no administration in time range)  insulin detemir (LEVEMIR) injection 10 Units (10 Units Subcutaneous Given 08/11/19 2337)  colesevelam [facility]) tablet 1,250 mg (has no administration in time range)  norgestrel-ethinyl estradiol (LO/OVRAL) 0.3-30 MG-MCG per tablet 1 tablet (has no administration in time range)  insulin aspart (novoLOG) injection 0-15 Units (8 Units Subcutaneous Given 08/12/19 0819)  remdesivir 100 mg in sodium chloride 0.9 % 100 mL IVPB (0 mg Intravenous Stopped 08/12/19 1103)   dexamethasone (DECADRON) tablet 6 mg (has no administration in time range)  iohexol (OMNIPAQUE) 350 MG/ML injection 125 mL (125 mLs Intravenous Contrast Given 08/11/19 0610)  0.9 %  sodium chloride infusion ( Intravenous Restarted 08/11/19 2334)  remdesivir 200 mg in sodium chloride 0.9 % 250 mL IVPB (0 mg Intravenous Stopped 08/11/19 0953)    Mobility walks Low fall risk   Focused Assessments    R Recommendations: See Admitting Provider Note  Report given to: Doran Heater RN  Additional Notes:

## 2019-08-12 NOTE — ED Notes (Signed)
Ordered breakfast--Lauren Murphy 

## 2019-08-12 NOTE — ED Notes (Signed)
Pt left cell phone in room uncovers. Pt father called [facility]. Phone given to security who will facilitate getting it to pt.

## 2019-08-12 NOTE — Plan of Care (Signed)
A & O, Pt up ad lib, on room air. Monitoring blood sugars. Medicated per orders, will cont to monitor.

## 2019-08-12 NOTE — Progress Notes (Signed)
Pt arrived from [facility] ED around 3:20 pm. While completing the patient's admission, she realized that she did not have her cell phone. Pt and nurse searched through her belongings and could not find her phone. A call was placed out to [facility] ED and I am currently waiting on a call back while they search for her phone.

## 2019-08-12 NOTE — ED Notes (Signed)
Called pharm to request missing medications.

## 2019-08-12 NOTE — Progress Notes (Signed)
Phone was delivered to [place] by her father and then delivered to the patient. Pt now has her cell phone at bedside.

## 2019-08-13 LAB — COMPREHENSIVE METABOLIC PANEL
ALT: 78 U/L — ABNORMAL HIGH (ref 0–44)
AST: 51 U/L — ABNORMAL HIGH (ref 15–41)
Albumin: 3.1 g/dL — ABNORMAL LOW (ref 3.5–5.0)
Alkaline Phosphatase: 39 U/L (ref 38–126)
Anion gap: 12 (ref 5–15)
BUN: 13 mg/dL (ref 6–20)
CO2: 26 mmol/L (ref 22–32)
Calcium: 8.2 mg/dL — ABNORMAL LOW (ref 8.9–10.3)
Chloride: 100 mmol/L (ref 98–111)
Creatinine, Ser: 0.71 mg/dL (ref 0.44–1.00)
GFR calc Af Amer: >60 mL/min (ref >60)
GFR calc non Af Amer: >60 mL/min (ref >60)
Glucose, Bld: 240 mg/dL — ABNORMAL HIGH (ref 70–99)
Potassium: 3.5 mmol/L (ref 3.5–5.1)
Sodium: 138 mmol/L (ref 135–145)
Total Bilirubin: 0.6 mg/dL (ref 0.3–1.2)
Total Protein: 6.8 g/dL (ref 6.5–8.1)

## 2019-08-13 LAB — CBC WITH DIFFERENTIAL/PLATELET
Abs Immature Granulocytes: 0.04 10*3/uL (ref 0.00–0.07)
Basophils Absolute: 0 10*3/uL (ref 0.0–0.1)
Basophils Relative: 0 %
Eosinophils Absolute: 0 10*3/uL (ref 0.0–0.5)
Eosinophils Relative: 0 %
HCT: 36.9 % (ref 36.0–46.0)
Hemoglobin: 11.6 g/dL — ABNORMAL LOW (ref 12.0–15.0)
Immature Granulocytes: 1 %
Lymphocytes Relative: 24 %
Lymphs Abs: 0.9 10*3/uL (ref 0.7–4.0)
MCH: 28.6 pg (ref 26.0–34.0)
MCHC: 31.4 g/dL (ref 30.0–36.0)
MCV: 91.1 fL (ref 80.0–100.0)
Monocytes Absolute: 0.3 10*3/uL (ref 0.1–1.0)
Monocytes Relative: 7 %
Neutro Abs: 2.6 10*3/uL (ref 1.7–7.7)
Neutrophils Relative %: 68 %
Platelets: 285 10*3/uL (ref 150–400)
RBC: 4.05 MIL/uL (ref 3.87–5.11)
RDW: 12.6 % (ref 11.5–15.5)
WBC: 3.8 10*3/uL — ABNORMAL LOW (ref 4.0–10.5)
nRBC: 0 % (ref 0.0–0.2)

## 2019-08-13 LAB — GLUCOSE, CAPILLARY
Glucose-Capillary: 226 mg/dL — ABNORMAL HIGH (ref 70–99)
Glucose-Capillary: 243 mg/dL — ABNORMAL HIGH (ref 70–99)
Glucose-Capillary: 227 mg/dL — ABNORMAL HIGH (ref 70–99)
Glucose-Capillary: 278 mg/dL — ABNORMAL HIGH (ref 70–99)

## 2019-08-13 LAB — TYPE AND SCREEN
ABO/RH(D): B POS
Antibody Screen: NEGATIVE

## 2019-08-13 LAB — ABO/RH: ABO/RH(D): B POS

## 2019-08-13 LAB — D-DIMER, QUANTITATIVE: D-Dimer, Quant: 0.47 ug/mL-FEU (ref 0.00–0.50)

## 2019-08-13 LAB — PHOSPHORUS: Phosphorus: 2.3 mg/dL — ABNORMAL LOW (ref 2.5–4.6)

## 2019-08-13 LAB — MAGNESIUM: Magnesium: 2 mg/dL (ref 1.7–2.4)

## 2019-08-13 LAB — FERRITIN: Ferritin: 398 ng/mL — ABNORMAL HIGH (ref 11–307)

## 2019-08-13 LAB — C-REACTIVE PROTEIN: CRP: 3.4 mg/dL — ABNORMAL HIGH (ref <1.0)

## 2019-08-13 MED ORDER — NORGESTREL-ETHINYL ESTRADIOL 0.3-30 MG-MCG PO TABS
1.0000 | ORAL_TABLET | Freq: Every day | ORAL | Status: DC
  Administered 2019-08-13 – 2019-08-14 (×2): 1 via ORAL

## 2019-08-13 MED ORDER — ENOXAPARIN SODIUM 80 MG/0.8ML ~~LOC~~ SOLN
70.0000 mg | Freq: Every day | SUBCUTANEOUS | Status: DC
  Administered 2019-08-14 – 2019-08-15 (×2): 70 mg via SUBCUTANEOUS
  Filled 2019-08-13 (×2): qty 0.8

## 2019-08-13 MED ORDER — DEXAMETHASONE 4 MG PO TABS
4.0000 mg | ORAL_TABLET | Freq: Every day | ORAL | Status: DC
  Administered 2019-08-13: 4 mg via ORAL
  Filled 2019-08-13: qty 1

## 2019-08-13 MED ORDER — FUROSEMIDE 20 MG PO TABS
40.0000 mg | ORAL_TABLET | Freq: Once | ORAL | Status: AC
  Administered 2019-08-13: 40 mg via ORAL
  Filled 2019-08-13: qty 2

## 2019-08-13 NOTE — Evaluation (Signed)
Physical Therapy Treatment Patient Details Name: Lauren Murphy MRN: [id number] DOB: May 15, 1981 Today's Date: 08/13/2019    History of Present Illness 38 y/o female pt w/ hx of HSV- 1, febrile seizures, DM, COVID 19 (since 11/30), cervical intraepithelial neoplams, cervical risk HPV, test positive. Presented to ED w/ c/o progressive SOB, w/ fever, HA, body aches, night sweat, loss of smell. Pt dx with PNA sec to COVID.    PT Comments    Pt admitted with above diagnosis. PTA was living home independent;y with family, works as Child psychotherapist. Pt currently with functional limitations due to the deficits listed below (see PT Problem List). Pt does well with mobility and is at SBA-mod I in room but with extended ambulation fatigues very quick and is on near syncopal episode needing seated rest to complete 144ft ambulation. Pt has been on room air and sats have remained in 90s throughout but near syncope noted. Have instructed pt to complete sit<>stand exercises, continue with ambulation in room but ambulate to time, or ask nursing staff to ambulate in hall with her. Pt will benefit from skilled PT while in hospital to increase her activity tolerance, independence and safety with mobility to allow discharge to the venue listed below.      Follow Up Recommendations  No PT follow up     Equipment Recommendations  None recommended by PT    Recommendations for Other Services OT consult     Precautions / Restrictions Precautions Precautions: None Restrictions Weight Bearing Restrictions: No    Mobility  Bed Mobility Overal bed mobility: Modified Independent                Transfers Overall transfer level: Modified independent Equipment used: None                Ambulation/Gait Ambulation/Gait assistance: Min guard;Min assist Gait Distance (Feet): 180 Feet Assistive device: None Gait Pattern/deviations: Step-through pattern Gait velocity: fair   General Gait Details: slow  moving, fatigued quickly as has not been ambulating long distances in some time   Stairs             Wheelchair Mobility    Modified Rankin (Stroke Patients Only)       Balance Overall balance assessment: Mild deficits observed, not formally tested                                          Cognition Arousal/Alertness: Awake/alert Behavior During Therapy: WFL for tasks assessed/performed Overall Cognitive Status: Within Functional Limits for tasks assessed                                        Exercises Other Exercises Other Exercises: instructed in sit<>stand exercizes    General Comments General comments (skin integrity, edema, etc.): on room air throughout session and sats in 90s      Pertinent Vitals/Pain Pain Assessment: No/denies pain    Home Living Family/patient expects to be discharged to:: Private residence Living Arrangements: Alone   Type of Home: House Home Access: Level entry(has hill)   Home Layout: One level        Prior Function Level of Independence: Independent          PT Goals (current goals can now be found in the care plan section) Acute  Rehab PT Goals Patient Stated Goal: 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, motivated to return home to child PT Goal Formulation: With patient Time For Goal Achievement: 08/27/19 Potential to Achieve Goals: Good    Frequency    Min 3X/week      PT Plan      Co-evaluation              AM-PAC PT "6 Clicks" Mobility   Outcome Measure  Help needed turning from your back to your side while in a flat bed without using bedrails?: None Help needed moving from lying on your back to sitting on the side of a flat bed without using bedrails?: None Help needed moving to and from a bed to a chair (including a wheelchair)?: None Help needed standing up from a chair using your arms (e.g., wheelchair or bedside chair)?: None Help needed to walk in  hospital room?: A Little Help needed climbing 3-5 steps with a railing? : A Little 6 Click Score: 22    End of Session   Activity Tolerance: Patient limited by lethargy;Patient limited by fatigue Patient left: in chair;with call bell/phone within reach Nurse Communication: Mobility status PT Visit Diagnosis: Other abnormalities of gait and mobility (R26.89)     Time: 1440-1500 PT Time Calculation (min) (ACUTE ONLY): 20 min  Charges:                        Horald Chestnut, PT    Delford Field 08/13/2019, 3:21 PM

## 2019-08-13 NOTE — Plan of Care (Signed)
  Problem: Education: Goal: Knowledge of risk factors and measures for prevention of condition will improve Outcome: Progressing   

## 2019-08-13 NOTE — Progress Notes (Signed)
PROGRESS NOTE                                                                                                                                                                                                             Patient Demographics:    Lauren Murphy, is a 38 y.o. female, DOB - 02-22-1981, LYY:[id number]  Outpatient Primary MD for the patient is Inda Coke, [place]    LOS - 2  Admit date - 08/10/2019    Chief Complaint  Patient presents with  . Covid positive, SOB,Fever       Brief Narrative - 38 year old with past medical history significant for diabetes type 2, diagnosed with Covid 19 on 11/30 who presents complaining of progressive shortness of breath over the last 3 days since diagnosis.  Patient also report of fever temperature 102, headache, body aches, night sweat, loss of smell.  She was taking prednisone after she was diagnosed with Covid.  She checked her oxygen saturation on ambulation at home and it dropped to 80, she presented to the ED for further evaluation.    Subjective:    Lauren Murphy today has, No headache, No chest pain, No abdominal pain - No Nausea, No new weakness tingling or numbness, mild Cough - no SOB.     Assessment  & Plan :     1. Acute Hypoxic Resp. Failure due to Acute Covid 19 Viral Pneumonitis during the ongoing 2020 Covid 19 Pandemic - she has mild to moderate disease, CT angiogram shows no PE, she has been treated with IV steroids and remdesivir.  At rest she is on room air.  She likely has undiagnosed sleep apnea for which she might require 1 to 2 L of oxygen at night when she sleeps, will benefit from outpatient sleep study.  Complete course of treatment and then discharged home.  Encouraged her to sit up in chair in the daytime use I-S and flutter valve for pulmonary toiletry and then prone in bed when at night.  SpO2: 94 %  Recent Labs  Lab 08/08/19 08/11/19 0819 08/11/19  0820 08/12/19 0435 08/13/19 0017  CRP  --   --  2.2* 4.2* 3.4*  DDIMER  --   --  0.72* 0.56* 0.47  FERRITIN  --   --  487* 473* 398*  BNP  --  13.9  --   --   --  PROCALCITON  --   --  <0.10  --   --   SARSCOV2NAA Positive  --   --   --   --     Hepatic Function Latest Ref Rng & Units 08/13/2019 08/12/2019 08/10/2019  Total Protein 6.5 - 8.1 g/dL 6.8 6.7 7.1  Albumin 3.5 - 5.0 g/dL 3.1(L) 2.9(L) 3.2(L)  AST 15 - 41 U/L 51(H) 63(H) 64(H)  ALT 0 - 44 U/L 78(H) 78(H) 66(H)  Alk Phosphatase 38 - 126 U/L 39 42 57  Total Bilirubin 0.3 - 1.2 mg/dL 0.6 0.5 0.5     2.  Cardiomegaly on CT scan.  Outpatient echocardiogram to be done by PCP.  BNP stable.  3.  Few crackles.  Single dose oral Lasix on 08/13/2019.  4.  Dyslipidemia.  Continue home medications.  5.  Mild Covid related transaminitis.  Stable trend, symptom-free.  Will monitor.  6.  Morbid obesity.  BMI 49.  Follow with PCP for weight loss.  7.  DM type II.  Stable outpatient control with A1c 7.6.  On Levemir and SSI.  CBG (last 3)  Recent Labs    08/12/19 1957 08/13/19 0102 08/13/19 0753  GLUCAP 252* 226* 243*     Condition - Fair  Family Communication  :  None  Code Status :  Fair  Diet :    Diet Order            Diet Carb Modified Fluid consistency: Thin; Room service appropriate? Yes  Diet effective now               Disposition Plan  :  Med-Surg  Consults  :  None  Procedures  :     PUD Prophylaxis : PPI  DVT Prophylaxis  :  Lovenox   Lab Results  Component Value Date   PLT 285 08/13/2019    Inpatient Medications  Scheduled Meds: . albuterol  2 puff Inhalation Q6H  . colesevelam  1,250 mg Oral Q breakfast  . dexamethasone  4 mg Oral Daily  . enoxaparin (LOVENOX) injection  40 mg Subcutaneous Daily  . insulin aspart  0-15 Units Subcutaneous TID WC  . insulin detemir  0.075 Units/kg Subcutaneous BID  . pantoprazole  40 mg Oral Daily  . sodium chloride flush  3 mL Intravenous Q12H   . vitamin C  500 mg Oral Daily  . zinc sulfate  220 mg Oral Daily   Continuous Infusions: . remdesivir 100 mg in NS 100 mL 100 mg (08/13/19 0959)   PRN Meds:.acetaminophen, chlorpheniramine-HYDROcodone, guaiFENesin-dextromethorphan, ondansetron **OR** ondansetron (ZOFRAN) IV  Antibiotics  :    Anti-infectives (From admission, onward)   Start     Dose/Rate Route Frequency Ordered Stop   08/12/19 1000  remdesivir 100 mg in sodium chloride 0.9 % 250 mL IVPB  Status:  Discontinued     100 mg 500 mL/hr over 30 Minutes Intravenous Every 24 hours 08/11/19 0803 08/12/19 0015   08/12/19 1000  remdesivir 100 mg in sodium chloride 0.9 % 100 mL IVPB     100 mg 200 mL/hr over 30 Minutes Intravenous Daily 08/12/19 0015 08/16/19 0959   08/11/19 0815  remdesivir 200 mg in sodium chloride 0.9 % 250 mL IVPB     200 mg 500 mL/hr over 30 Minutes Intravenous Once 08/11/19 0803 08/11/19 0953       Time Spent in minutes  30   Lala Lund M.D on 08/13/2019 at 10:41 AM  To page go to [email or web address] -  password [email or web address]  [phone]  See all Orders from today for further details    Objective:   Vitals:   08/12/19 1954 08/13/19 0446 08/13/19 0449 08/13/19 0755  BP: 121/87  120/62 117/85  Pulse: 77  88 90  Resp: (!) 25  (!) 24 16  Temp: 98.4 F (36.9 C) 98.1 F (36.7 C)  98.2 F (36.8 C)  TempSrc: Oral Oral  Oral  SpO2: 90%  (!) 84% 94%  Weight:      Height:        Wt Readings from Last 3 Encounters:  08/11/19 (!) 139.6 kg  07/25/19 (!) 139.6 kg  03/01/19 (!) 138.3 kg     Intake/Output Summary (Last 24 hours) at 08/13/2019 1042 Last data filed at 08/13/2019 0600 Gross per 24 hour  Intake 220 ml  Output 0 ml  Net 220 ml     Physical Exam  Awake Alert,  No new F.N deficits, Normal affect [place].AT,PERRAL Supple Neck,No JVD, No cervical lymphadenopathy appriciated.  Symmetrical Chest wall movement, Good air movement bilaterally, CTAB RRR,No  Gallops,Rubs or new Murmurs, No Parasternal Heave +ve B.Sounds, Abd Soft, No tenderness, No organomegaly appriciated, No rebound - guarding or rigidity. No Cyanosis, Clubbing or edema, No new Rash or bruise       Data Review:    CBC Recent Labs  Lab 08/10/19 2226 08/12/19 0435 08/13/19 0017  WBC 6.0 3.0* 3.8*  HGB 13.2 11.9* 11.6*  HCT 40.2 36.5 36.9  PLT 239 236 285  MCV 90.3 91.3 91.1  MCH 29.7 29.8 28.6  MCHC 32.8 32.6 31.4  RDW 12.3 12.7 12.6  LYMPHSABS 0.8 0.9 0.9  MONOABS 0.2 0.3 0.3  EOSABS 0.0 0.0 0.0  BASOSABS 0.0 0.0 0.0    Chemistries  Recent Labs  Lab 08/10/19 2226 08/12/19 0435 08/13/19 0017  NA 134* 136 138  K 3.9 3.8 3.5  CL 100 100 100  CO2 21* 23 26  GLUCOSE 381* 294* 240*  BUN _0 CREATININE 0.93 0.82 0.71  CALCIUM 9.0 8.2* 8.2*  MG  --  1.7 2.0  AST 64* 63* 51*  ALT 66* 78* 78*  ALKPHOS 57 42 39  BILITOT 0.5 0.5 0.6   ------------------------------------------------------------------------------------------------------------------ No results for input(s): CHOL, HDL, LDLCALC, TRIG, CHOLHDL, LDLDIRECT in the last 72 hours.  Lab Results  Component Value Date   HGBA1C 7.7 (H) 08/10/2019   ------------------------------------------------------------------------------------------------------------------ No results for input(s): TSH, T4TOTAL, T3FREE, THYROIDAB in the last 72 hours.  Invalid input(s): FREET3  Cardiac Enzymes No results for input(s): CKMB, TROPONINI, MYOGLOBIN in the last 168 hours.  Invalid input(s): CK ------------------------------------------------------------------------------------------------------------------    Component Value Date/Time   BNP 13.9 08/11/2019 0819    Micro Results Recent Results (from the past 240 hour(s))  Novel Coronavirus, NAA ([facility])     Status: None   Collection Time: 08/08/19 12:00 AM   Specimen: Nasopharyngeal(NP) swabs in vial transport medium  Result Value Ref Range Status    SARS-CoV-2, NAA Positive  Final    Radiology Reports Ct Angio Chest Pe W And/or Wo Contrast  Result Date: 08/11/2019 CLINICAL DATA:  Positive COVID-19 test. Shortness of breath. Productive cough. EXAM: CT ANGIOGRAPHY CHEST WITH CONTRAST TECHNIQUE: Multidetector CT imaging of the chest was performed using the standard protocol during bolus administration of intravenous contrast. Multiplanar CT image reconstructions and MIPs were obtained to evaluate the vascular anatomy. CONTRAST:  137m OMNIPAQUE IOHEXOL 350 MG/ML SOLN COMPARISON:  One-view chest  x-ray 08/10/2019 FINDINGS: Cardiovascular: Heart is enlarged. Aorta and great vessel origins are within normal limits. Pulmonary artery opacification is satisfactory. The study is mildly degraded by patient breathing motion. No significant pulmonary embolus is present. Mediastinum/Nodes: No significant mediastinal, hilar, or axillary adenopathy is present. Lungs/Pleura: Patchy bilateral airspace opacities are present, most prominent at the lung bases. Findings are consistent with COVID-19 pneumonia. There is no pneumothorax or significant pleural effusion. Upper Abdomen: The upper abdomen is within normal limits. Musculoskeletal: Vertebral body heights and alignment are normal. No focal lytic or blastic lesions are present. The ribs are unremarkable. Review of the MIP images confirms the above findings. IMPRESSION: 1. No significant pulmonary embolus. 2. Cardiomegaly without failure. 3. Patchy bilateral airspace opacities compatible with COVID-19 pneumonia. Electronically Signed   By: San Morelle M.D.   On: 08/11/2019 06:50   Dg Chest Portable 1 View  Result Date: 08/10/2019 CLINICAL DATA:  Shortness of breath, COVID positive EXAM: PORTABLE CHEST 1 VIEW COMPARISON:  None. FINDINGS: Streaky bibasilar atelectasis. No consolidation or effusion. Normal heart size. No pneumothorax. IMPRESSION: Streaky basilar atelectasis. Electronically Signed   By: Donavan Foil M.D.   On: 08/10/2019 23:01

## 2019-08-14 LAB — COMPREHENSIVE METABOLIC PANEL
ALT: 69 U/L — ABNORMAL HIGH (ref 0–44)
AST: 38 U/L (ref 15–41)
Albumin: 3.1 g/dL — ABNORMAL LOW (ref 3.5–5.0)
Alkaline Phosphatase: 36 U/L — ABNORMAL LOW (ref 38–126)
Anion gap: 16 — ABNORMAL HIGH (ref 5–15)
BUN: 13 mg/dL (ref 6–20)
CO2: 23 mmol/L (ref 22–32)
Calcium: 8.2 mg/dL — ABNORMAL LOW (ref 8.9–10.3)
Chloride: 101 mmol/L (ref 98–111)
Creatinine, Ser: 0.65 mg/dL (ref 0.44–1.00)
GFR calc Af Amer: >60 mL/min (ref >60)
GFR calc non Af Amer: >60 mL/min (ref >60)
Glucose, Bld: 243 mg/dL — ABNORMAL HIGH (ref 70–99)
Potassium: 3.5 mmol/L (ref 3.5–5.1)
Sodium: 140 mmol/L (ref 135–145)
Total Bilirubin: 0.8 mg/dL (ref 0.3–1.2)
Total Protein: 6.8 g/dL (ref 6.5–8.1)

## 2019-08-14 LAB — GLUCOSE, CAPILLARY
Glucose-Capillary: 161 mg/dL — ABNORMAL HIGH (ref 70–99)
Glucose-Capillary: 157 mg/dL — ABNORMAL HIGH (ref 70–99)
Glucose-Capillary: 247 mg/dL — ABNORMAL HIGH (ref 70–99)
Glucose-Capillary: 219 mg/dL — ABNORMAL HIGH (ref 70–99)

## 2019-08-14 LAB — CBC WITH DIFFERENTIAL/PLATELET
Abs Immature Granulocytes: 0.07 10*3/uL (ref 0.00–0.07)
Basophils Absolute: 0 10*3/uL (ref 0.0–0.1)
Basophils Relative: 1 %
Eosinophils Absolute: 0 10*3/uL (ref 0.0–0.5)
Eosinophils Relative: 0 %
HCT: 36.5 % (ref 36.0–46.0)
Hemoglobin: 11.5 g/dL — ABNORMAL LOW (ref 12.0–15.0)
Immature Granulocytes: 2 %
Lymphocytes Relative: 32 %
Lymphs Abs: 1.2 10*3/uL (ref 0.7–4.0)
MCH: 28.8 pg (ref 26.0–34.0)
MCHC: 31.5 g/dL (ref 30.0–36.0)
MCV: 91.3 fL (ref 80.0–100.0)
Monocytes Absolute: 0.4 10*3/uL (ref 0.1–1.0)
Monocytes Relative: 9 %
Neutro Abs: 2.2 10*3/uL (ref 1.7–7.7)
Neutrophils Relative %: 56 %
Platelets: 317 10*3/uL (ref 150–400)
RBC: 4 MIL/uL (ref 3.87–5.11)
RDW: 12.8 % (ref 11.5–15.5)
WBC: 3.9 10*3/uL — ABNORMAL LOW (ref 4.0–10.5)
nRBC: 0 % (ref 0.0–0.2)

## 2019-08-14 LAB — C-REACTIVE PROTEIN: CRP: 1.6 mg/dL — ABNORMAL HIGH (ref <1.0)

## 2019-08-14 LAB — D-DIMER, QUANTITATIVE: D-Dimer, Quant: 0.59 ug/mL-FEU — ABNORMAL HIGH (ref 0.00–0.50)

## 2019-08-14 LAB — MAGNESIUM: Magnesium: 2 mg/dL (ref 1.7–2.4)

## 2019-08-14 LAB — BRAIN NATRIURETIC PEPTIDE: B Natriuretic Peptide: 16.7 pg/mL (ref 0.0–100.0)

## 2019-08-14 MED ORDER — DEXAMETHASONE 2 MG PO TABS
2.0000 mg | ORAL_TABLET | Freq: Every day | ORAL | Status: DC
  Administered 2019-08-14 – 2019-08-15 (×2): 2 mg via ORAL
  Filled 2019-08-14 (×2): qty 1

## 2019-08-14 MED ORDER — POTASSIUM CHLORIDE CRYS ER 20 MEQ PO TBCR
40.0000 meq | EXTENDED_RELEASE_TABLET | Freq: Once | ORAL | Status: AC
  Administered 2019-08-14: 40 meq via ORAL
  Filled 2019-08-14: qty 2

## 2019-08-14 MED ORDER — MAGNESIUM HYDROXIDE 400 MG/5ML PO SUSP
30.0000 mL | Freq: Two times a day (BID) | ORAL | Status: AC
  Administered 2019-08-14: 30 mL via ORAL
  Filled 2019-08-14 (×3): qty 30

## 2019-08-14 MED ORDER — ZOLPIDEM TARTRATE 5 MG PO TABS
5.0000 mg | ORAL_TABLET | Freq: Every evening | ORAL | Status: DC | PRN
  Administered 2019-08-14 (×2): 5 mg via ORAL
  Filled 2019-08-14 (×2): qty 1

## 2019-08-14 NOTE — Progress Notes (Signed)
Pt declined phone call to family/father at this time for updates, states she has been in touch via her private phone at bedside.

## 2019-08-14 NOTE — Progress Notes (Signed)
PROGRESS NOTE                                                                                                                                                                                                             Patient Demographics:    Lauren Murphy, is a 38 y.o. female, DOB - May 31, 1981, JKD:[id number]  Outpatient Primary MD for the patient is Inda Coke, [place]    LOS - 3  Admit date - 08/10/2019    Chief Complaint  Patient presents with  . Covid positive, SOB,Fever       Brief Narrative - 38 year old with past medical history significant for diabetes type 2, diagnosed with Covid 19 on 11/30 who presents complaining of progressive shortness of breath over the last 3 days since diagnosis.  Patient also report of fever temperature 102, headache, body aches, night sweat, loss of smell.  She was taking prednisone after she was diagnosed with Covid.  She checked her oxygen saturation on ambulation at home and it dropped to 80, she presented to the ED for further evaluation.    Subjective:   Patient in bed, appears comfortable, denies any headache, no fever, no chest pain or pressure, no shortness of breath , no abdominal pain. No focal weakness.     Assessment  & Plan :     1. Acute Hypoxic Resp. Failure due to Acute Covid 19 Viral Pneumonitis during the ongoing 2020 Covid 19 Pandemic - she has mild to moderate disease, CT angiogram shows no PE, she has been treated with IV steroids and remdesivir.  At rest she is on room air.  She likely has undiagnosed sleep apnea for which she might require 1 to 2 L of oxygen at night when she sleeps, will benefit from outpatient sleep study.  She remains completely symptom-free in daytime, finish her treatment course and discharged home.  Start tapering steroids.  Encouraged her to sit up in chair in the daytime use I-S and flutter valve for pulmonary toiletry and then prone in bed  when at night.  SpO2: 95 %  Recent Labs  Lab 08/08/19 08/11/19 0819 08/11/19 0820 08/12/19 0435 08/13/19 0017 08/14/19 0055  CRP  --   --  2.2* 4.2* 3.4* 1.6*  DDIMER  --   --  0.72* 0.56* 0.47 0.59*  FERRITIN  --   --  487* 473* 398*  --   BNP  --  13.9  --   --   --  16.7  PROCALCITON  --   --  <0.10  --   --   --   SARSCOV2NAA Positive  --   --   --   --   --     Hepatic Function Latest Ref Rng & Units 08/14/2019 08/13/2019 08/12/2019  Total Protein 6.5 - 8.1 g/dL 6.8 6.8 6.7  Albumin 3.5 - 5.0 g/dL 3.1(L) 3.1(L) 2.9(L)  AST 15 - 41 U/L 38 51(H) 63(H)  ALT 0 - 44 U/L 69(H) 78(H) 78(H)  Alk Phosphatase 38 - 126 U/L 36(L) 39 42  Total Bilirubin 0.3 - 1.2 mg/dL 0.8 0.6 0.5     2.  Cardiomegaly on CT scan.  Outpatient echocardiogram to be done by PCP.  BNP stable.  3.  Few crackles.  Single dose oral Lasix on 08/13/2019.  4.  Dyslipidemia.  Continue home medications.  5.  Mild Covid related transaminitis.  Stable trend, symptom-free.  Will monitor.  6.  Morbid obesity.  BMI 49.  Follow with PCP for weight loss.  7.  DM type II.  Stable outpatient control with A1c 7.6.  On Levemir and SSI.  CBG (last 3)  Recent Labs    08/13/19 1142 08/13/19 1707 08/14/19 0730  GLUCAP 227* 278* 161*     Condition - Fair  Family Communication  :  None  Code Status :  Fair  Diet :    Diet Order            Diet Carb Modified Fluid consistency: Thin; Room service appropriate? Yes  Diet effective now               Disposition Plan  : Home on 08/16/2019 once finishes remdesivir.  Consults  :  None  Procedures  :     PUD Prophylaxis : PPI  DVT Prophylaxis  :  Lovenox   Lab Results  Component Value Date   PLT 317 08/14/2019    Inpatient Medications  Scheduled Meds: . albuterol  2 puff Inhalation Q6H  . colesevelam  1,250 mg Oral Q breakfast  . dexamethasone  2 mg Oral Daily  . enoxaparin (LOVENOX) injection  70 mg Subcutaneous Daily  . insulin aspart  0-15  Units Subcutaneous TID WC  . insulin detemir  0.075 Units/kg Subcutaneous BID  . norgestrel-ethinyl estradiol  1 tablet Oral Daily  . pantoprazole  40 mg Oral Daily  . sodium chloride flush  3 mL Intravenous Q12H  . vitamin C  500 mg Oral Daily  . zinc sulfate  220 mg Oral Daily   Continuous Infusions: . remdesivir 100 mg in NS 100 mL 100 mg (08/14/19 1017)   PRN Meds:.acetaminophen, chlorpheniramine-HYDROcodone, guaiFENesin-dextromethorphan, ondansetron **OR** ondansetron (ZOFRAN) IV, zolpidem  Antibiotics  :    Anti-infectives (From admission, onward)   Start     Dose/Rate Route Frequency Ordered Stop   08/12/19 1000  remdesivir 100 mg in sodium chloride 0.9 % 250 mL IVPB  Status:  Discontinued     100 mg 500 mL/hr over 30 Minutes Intravenous Every 24 hours 08/11/19 0803 08/12/19 0015   08/12/19 1000  remdesivir 100 mg in sodium chloride 0.9 % 100 mL IVPB     100 mg 200 mL/hr over 30 Minutes Intravenous Daily 08/12/19 0015 08/16/19 0959   08/11/19 0815  remdesivir 200 mg in sodium chloride 0.9 % 250 mL IVPB  200 mg 500 mL/hr over 30 Minutes Intravenous Once 08/11/19 0803 08/11/19 0953       Time Spent in minutes  30   Lala Lund M.D on 08/14/2019 at 10:48 AM  To page go to [email or web address] - password [facility]  [facility] -  Office  [phone]  See all Orders from today for further details    Objective:   Vitals:   08/13/19 1650 08/13/19 2057 08/14/19 0511 08/14/19 0726  BP: 139/83 112/88 137/73 126/81  Pulse: 81  89 75  Resp: '16 18 18   '$ Temp: 97.8 F (36.6 C) 98.9 F (37.2 C) 98.5 F (36.9 C) 98.7 F (37.1 C)  TempSrc: Oral Oral Oral Oral  SpO2: 94%  92% 95%  Weight:      Height:        Wt Readings from Last 3 Encounters:  08/11/19 (!) 139.6 kg  07/25/19 (!) 139.6 kg  03/01/19 (!) 138.3 kg     Intake/Output Summary (Last 24 hours) at 08/14/2019 1048 Last data filed at 08/13/2019 2000 Gross per 24 hour  Intake 240 ml  Output -  Net 240 ml      Physical Exam  Awake Alert,  No new F.N deficits, Normal affect [place].AT,PERRAL Supple Neck,No JVD, No cervical lymphadenopathy appriciated.  Symmetrical Chest wall movement, Good air movement bilaterally, CTAB RRR,No Gallops, Rubs or new Murmurs, No Parasternal Heave +ve B.Sounds, Abd Soft, No tenderness, No organomegaly appriciated, No rebound - guarding or rigidity. No Cyanosis, Clubbing or edema, No new Rash or bruise     Data Review:    CBC Recent Labs  Lab 08/10/19 2226 08/12/19 0435 08/13/19 0017 08/14/19 0055  WBC 6.0 3.0* 3.8* 3.9*  HGB 13.2 11.9* 11.6* 11.5*  HCT 40.2 36.5 36.9 36.5  PLT 239 236 285 317  MCV 90.3 91.3 91.1 91.3  MCH 29.7 29.8 28.6 28.8  MCHC 32.8 32.6 31.4 31.5  RDW 12.3 12.7 12.6 12.8  LYMPHSABS 0.8 0.9 0.9 1.2  MONOABS 0.2 0.3 0.3 0.4  EOSABS 0.0 0.0 0.0 0.0  BASOSABS 0.0 0.0 0.0 0.0    Chemistries  Recent Labs  Lab 08/10/19 2226 08/12/19 0435 08/13/19 0017 08/14/19 0055  NA 134* 136 138 140  K 3.9 3.8 3.5 3.5  CL 100 100 100 101  CO2 21* '23 26 23  '$ GLUCOSE 381* 294* 240* 243*  BUN '8 10 13 13  '$ CREATININE 0.93 0.82 0.71 0.65  CALCIUM 9.0 8.2* 8.2* 8.2*  MG  --  1.7 2.0 2.0  AST 64* 63* 51* 38  ALT 66* 78* 78* 69*  ALKPHOS 57 42 39 36*  BILITOT 0.5 0.5 0.6 0.8   ------------------------------------------------------------------------------------------------------------------ No results for input(s): CHOL, HDL, LDLCALC, TRIG, CHOLHDL, LDLDIRECT in the last 72 hours.  Lab Results  Component Value Date   HGBA1C 7.7 (H) 08/10/2019   ------------------------------------------------------------------------------------------------------------------ No results for input(s): TSH, T4TOTAL, T3FREE, THYROIDAB in the last 72 hours.  Invalid input(s): FREET3  Cardiac Enzymes No results for input(s): CKMB, TROPONINI, MYOGLOBIN in the last 168 hours.  Invalid input(s): CK  ------------------------------------------------------------------------------------------------------------------    Component Value Date/Time   BNP 16.7 08/14/2019 0055    Micro Results Recent Results (from the past 240 hour(s))  Novel Coronavirus, NAA ([facility])     Status: None   Collection Time: 08/08/19 12:00 AM   Specimen: Nasopharyngeal(NP) swabs in vial transport medium  Result Value Ref Range Status   SARS-CoV-2, NAA Positive  Final    Radiology Reports Ct Angio Chest Pe  W And/or Wo Contrast  Result Date: 08/11/2019 CLINICAL DATA:  Positive COVID-19 test. Shortness of breath. Productive cough. EXAM: CT ANGIOGRAPHY CHEST WITH CONTRAST TECHNIQUE: Multidetector CT imaging of the chest was performed using the standard protocol during bolus administration of intravenous contrast. Multiplanar CT image reconstructions and MIPs were obtained to evaluate the vascular anatomy. CONTRAST:  166m OMNIPAQUE IOHEXOL 350 MG/ML SOLN COMPARISON:  One-view chest x-ray 08/10/2019 FINDINGS: Cardiovascular: Heart is enlarged. Aorta and great vessel origins are within normal limits. Pulmonary artery opacification is satisfactory. The study is mildly degraded by patient breathing motion. No significant pulmonary embolus is present. Mediastinum/Nodes: No significant mediastinal, hilar, or axillary adenopathy is present. Lungs/Pleura: Patchy bilateral airspace opacities are present, most prominent at the lung bases. Findings are consistent with COVID-19 pneumonia. There is no pneumothorax or significant pleural effusion. Upper Abdomen: The upper abdomen is within normal limits. Musculoskeletal: Vertebral body heights and alignment are normal. No focal lytic or blastic lesions are present. The ribs are unremarkable. Review of the MIP images confirms the above findings. IMPRESSION: 1. No significant pulmonary embolus. 2. Cardiomegaly without failure. 3. Patchy bilateral airspace opacities compatible with COVID-19  pneumonia. Electronically Signed   By: CSan MorelleM.D.   On: 08/11/2019 06:50   Dg Chest Portable 1 View  Result Date: 08/10/2019 CLINICAL DATA:  Shortness of breath, COVID positive EXAM: PORTABLE CHEST 1 VIEW COMPARISON:  None. FINDINGS: Streaky bibasilar atelectasis. No consolidation or effusion. Normal heart size. No pneumothorax. IMPRESSION: Streaky basilar atelectasis. Electronically Signed   By: KDonavan FoilM.D.   On: 08/10/2019 23:01

## 2019-08-14 NOTE — Plan of Care (Signed)
  Problem: Education: Goal: Knowledge of risk factors and measures for prevention of condition will improve Outcome: Progressing   Problem: Coping: Goal: Psychosocial and spiritual needs will be supported Outcome: Progressing   Problem: Respiratory: Goal: Will maintain a patent airway Outcome: Progressing   

## 2019-08-15 ENCOUNTER — Telehealth: Payer: Self-pay | Admitting: Physician Assistant

## 2019-08-15 LAB — COMPREHENSIVE METABOLIC PANEL
ALT: 68 U/L — ABNORMAL HIGH (ref 0–44)
AST: 36 U/L (ref 15–41)
Albumin: 2.9 g/dL — ABNORMAL LOW (ref 3.5–5.0)
Alkaline Phosphatase: 38 U/L (ref 38–126)
Anion gap: 12 (ref 5–15)
BUN: 13 mg/dL (ref 6–20)
CO2: 25 mmol/L (ref 22–32)
Calcium: 8.2 mg/dL — ABNORMAL LOW (ref 8.9–10.3)
Chloride: 102 mmol/L (ref 98–111)
Creatinine, Ser: 0.69 mg/dL (ref 0.44–1.00)
GFR calc Af Amer: >60 mL/min (ref >60)
GFR calc non Af Amer: >60 mL/min (ref >60)
Glucose, Bld: 206 mg/dL — ABNORMAL HIGH (ref 70–99)
Potassium: 3.6 mmol/L (ref 3.5–5.1)
Sodium: 139 mmol/L (ref 135–145)
Total Bilirubin: 0.4 mg/dL (ref 0.3–1.2)
Total Protein: 6.6 g/dL (ref 6.5–8.1)

## 2019-08-15 LAB — CBC WITH DIFFERENTIAL/PLATELET
Abs Immature Granulocytes: 0.06 10*3/uL (ref 0.00–0.07)
Basophils Absolute: 0 10*3/uL (ref 0.0–0.1)
Basophils Relative: 0 %
Eosinophils Absolute: 0 10*3/uL (ref 0.0–0.5)
Eosinophils Relative: 0 %
HCT: 36.8 % (ref 36.0–46.0)
Hemoglobin: 11.6 g/dL — ABNORMAL LOW (ref 12.0–15.0)
Immature Granulocytes: 1 %
Lymphocytes Relative: 38 %
Lymphs Abs: 2 10*3/uL (ref 0.7–4.0)
MCH: 28.8 pg (ref 26.0–34.0)
MCHC: 31.5 g/dL (ref 30.0–36.0)
MCV: 91.3 fL (ref 80.0–100.0)
Monocytes Absolute: 0.4 10*3/uL (ref 0.1–1.0)
Monocytes Relative: 8 %
Neutro Abs: 2.7 10*3/uL (ref 1.7–7.7)
Neutrophils Relative %: 53 %
Platelets: 352 10*3/uL (ref 150–400)
RBC: 4.03 MIL/uL (ref 3.87–5.11)
RDW: 12.6 % (ref 11.5–15.5)
WBC: 5.2 10*3/uL (ref 4.0–10.5)
nRBC: 0 % (ref 0.0–0.2)

## 2019-08-15 LAB — MAGNESIUM: Magnesium: 2.1 mg/dL (ref 1.7–2.4)

## 2019-08-15 LAB — GLUCOSE, CAPILLARY
Glucose-Capillary: 213 mg/dL — ABNORMAL HIGH (ref 70–99)
Glucose-Capillary: 248 mg/dL — ABNORMAL HIGH (ref 70–99)
Glucose-Capillary: 138 mg/dL — ABNORMAL HIGH (ref 70–99)

## 2019-08-15 LAB — BRAIN NATRIURETIC PEPTIDE: B Natriuretic Peptide: 13.6 pg/mL (ref 0.0–100.0)

## 2019-08-15 LAB — D-DIMER, QUANTITATIVE: D-Dimer, Quant: 0.55 ug/mL-FEU — ABNORMAL HIGH (ref 0.00–0.50)

## 2019-08-15 LAB — C-REACTIVE PROTEIN: CRP: 0.9 mg/dL (ref <1.0)

## 2019-08-15 MED ORDER — ALBUTEROL SULFATE HFA 108 (90 BASE) MCG/ACT IN AERS: 2.0000 | INHALATION_SPRAY | Freq: Four times a day (QID) | RESPIRATORY_TRACT | 0 refills | Status: DC | PRN

## 2019-08-15 NOTE — Discharge Summary (Signed)
Lauren Murphy KSK:[id number] DOB: [birth year] DOA: 08/10/2019  PCP: Lauren Coke, PA  Admit date: 08/10/2019  Discharge date: 08/15/2019  Admitted From: Home   Disposition:  Home   Recommendations for Outpatient Follow-up:   Follow up with PCP in 1-2 weeks  PCP Please obtain BMP/CBC, 2 view CXR in 1week,  (see Discharge instructions)   PCP Please follow up on the following pending results: Please schedule Outpt Sleep study and Echo.   Home Health: None   Equipment/Devices: None  Consultations: None  Discharge Condition: Stable    CODE STATUS: Full    Diet Recommendation: Heart Healthy Low Carbv     Chief Complaint  Patient presents with   Covid positive, SOB,Fever     Brief history of present illness from the day of admission and additional interim summary    38 year old with past medical history significant for diabetes type 2, diagnosed with Covid 19 on 11/30 who presents complaining of progressive shortness of breath over the last 3 days since diagnosis.Patient also report of fever temperature 102, headache, body aches, night sweat, loss of smell. She was taking prednisone after she was diagnosed with Covid. She checked her oxygen saturation on ambulation at home and it dropped to 80, she presented to the ED for further evaluation.                                                                 Hospital Course    1. Acute Hypoxic Resp. Failure due to Acute Covid 19 Viral Pneumonitis during the ongoing 2020 Covid 19 Pandemic - she has mild to moderate disease, CT angiogram shows no PE, she has been treated with IV steroids and remdesivir.  At rest she is on room air.  She likely has undiagnosed sleep apnea for which she might require 1 to 2 L of oxygen at night when she sleeps, will benefit from  outpatient sleep study.  She remains completely symptom-free in daytime, finished her treatment course today and will be discharged home.      COVID-19 Labs  Recent Labs    08/13/19 0017 08/14/19 0055 08/15/19 0105  DDIMER 0.47 0.59* 0.55*  FERRITIN 398*  --   --   CRP 3.4* 1.6* 0.9    Lab Results  Component Value Date   SARSCOV2NAA Positive 08/08/2019    Hepatic Function Latest Ref Rng & Units 08/15/2019 08/14/2019 08/13/2019  Total Protein 6.5 - 8.1 g/dL 6.6 6.8 6.8  Albumin 3.5 - 5.0 g/dL 2.9(L) 3.1(L) 3.1(L)  AST 15 - 41 U/L 36 38 51(H)  ALT 0 - 44 U/L 68(H) 69(H) 78(H)  Alk Phosphatase 38 - 126 U/L 38 36(L) 39  Total Bilirubin 0.3 - 1.2 mg/dL 0.4 0.8 0.6    2.  Cardiomegaly on CT scan.  Outpatient echocardiogram to  be done by PCP.  BNP stable.  3.  Few crackles.  Resolved after a single dose of oral Lasix on 08/13/2019.  4.  Dyslipidemia.  Continue home medications.  5.  Mild Covid related transaminitis.  Stable trend, symptom-free.  Will monitor.  6.  Morbid obesity.  BMI 49.  Follow with PCP for weight loss.  7.  DM type II.  Stable outpatient control with A1c 7.6.  follow with PCP.  Lab Results  Component Value Date   HGBA1C 7.7 (H) 08/10/2019     Discharge diagnosis     Principal Problem:   Pneumonia due to COVID-19 virus Active Problems:   Hypoxia   Uncontrolled type 2 diabetes mellitus with hyperglycemia ([place])   Cardiomegaly   Hyperlipidemia   Transaminitis    Discharge instructions    Discharge Instructions    Discharge instructions   Complete by: As directed    Follow with Primary MD Lauren Coke, PA in 7 days get an outpatient Echocardiogram scheduled by your PCP.  Get CBC, CMP, 2 view Chest X ray -  checked next visit within 1 week by Primary MD    Activity: As tolerated with Full fall precautions use walker/cane & assistance as needed  Disposition Home    Diet: Heart Healthy  Low Carb,  Special Instructions: If you have  smoked or chewed Tobacco  in the last 2 yrs please stop smoking, stop any regular Alcohol  and or any Recreational drug use.  On your next visit with your primary care physician please Get Medicines reviewed and adjusted.  Please request your Prim.MD to go over all Hospital Tests and Procedure/Radiological results at the follow up, please get all Hospital records sent to your Prim MD by signing hospital release before you go home.  If you experience worsening of your admission symptoms, develop shortness of breath, life threatening emergency, suicidal or homicidal thoughts you must seek medical attention immediately by calling 911 or calling your MD immediately  if symptoms less severe.  You Must read complete instructions/literature along with all the possible adverse reactions/side effects for all the Medicines you take and that have been prescribed to you. Take any new Medicines after you have completely understood and accpet all the possible adverse reactions/side effects.                                                                           Lauren Murphy                            [place], [place] [address]      Lauren Murphy was admitted to the Hospital on 08/10/2019 and Discharged  08/15/2019 and should be excused from work/school   for 21 days starting from date -  08/10/2019 , may return to work/school without any restrictions.  Call Lauren Lund MD, [facility]  [phone] with questions.  Lauren Murphy on 08/15/2019,at 9:26 AM  [facility]   Office  [phone]  Increase activity slowly   Complete by: As directed       Discharge Medications   Allergies as of 08/15/2019   No Known Allergies     Medication List    STOP taking these medications   ibuprofen 200 MG tablet Commonly known as: ADVIL   naproxen 375 MG tablet Commonly known as: NAPROSYN   predniSONE 20 MG tablet Commonly  known as: DELTASONE     TAKE these medications   acetaminophen 500 MG tablet Commonly known as: TYLENOL Take 1,000 mg by mouth every 6 (six) hours as needed for mild pain or headache.   albuterol 108 (90 Base) MCG/ACT inhaler Commonly known as: VENTOLIN HFA Inhale 2 puffs into the lungs every 6 (six) hours as needed for wheezing or shortness of breath.   Alka-Seltzer Plus Cold 2-7.8-325 MG Tbef Generic drug: Chlorphen-Phenyleph-ASA Take 2 tablets by mouth every 6 (six) hours as needed (cold symptoms).   benzonatate 100 MG capsule Commonly known as: TESSALON Take by mouth 2 (two) times daily as needed for cough.   budesonide-formoterol 160-4.5 MCG/ACT inhaler Commonly known as: SYMBICORT Inhale 2 puffs into the lungs 2 (two) times daily.   colesevelam 625 MG tablet Commonly known as: Welchol Take 2 tablets (1,250 mg total) by mouth daily with breakfast.   Cryselle-28 0.3-30 MG-MCG tablet Generic drug: norgestrel-ethinyl estradiol Take 1 tablet by mouth daily.   nystatin powder Commonly known as: MYCOSTATIN/NYSTOP Apply topically 3 (three) times daily. Apply to affected area for up to 7 days What changed:   when to take this  reasons to take this   traZODone 50 MG tablet Commonly known as: DESYREL Take 25-50 mg by mouth at bedtime as needed for sleep.       Follow-up Information    Lauren Murphy, [place]. Schedule an appointment as soon as possible for a visit in 1 week(s).   Specialty: Physician Assistant Contact information: [place] [address] [phone]           Major procedures and Radiology Reports - PLEASE review detailed and final reports thoroughly  -         Ct Angio Chest Pe W And/or Wo Contrast  Result Date: 08/11/2019 CLINICAL DATA:  Positive COVID-19 test. Shortness of breath. Productive cough. EXAM: CT ANGIOGRAPHY CHEST WITH CONTRAST TECHNIQUE: Multidetector CT imaging of the chest was performed using the standard  protocol during bolus administration of intravenous contrast. Multiplanar CT image reconstructions and MIPs were obtained to evaluate the vascular anatomy. CONTRAST:  117m OMNIPAQUE IOHEXOL 350 MG/ML SOLN COMPARISON:  One-view chest x-ray 08/10/2019 FINDINGS: Cardiovascular: Heart is enlarged. Aorta and great vessel origins are within normal limits. Pulmonary artery opacification is satisfactory. The study is mildly degraded by patient breathing motion. No significant pulmonary embolus is present. Mediastinum/Nodes: No significant mediastinal, hilar, or axillary adenopathy is present. Lungs/Pleura: Patchy bilateral airspace opacities are present, most prominent at the lung bases. Findings are consistent with COVID-19 pneumonia. There is no pneumothorax or significant pleural effusion. Upper Abdomen: The upper abdomen is within normal limits. Musculoskeletal: Vertebral body heights and alignment are normal. No focal lytic or blastic lesions are present. The ribs are unremarkable. Review of the MIP images confirms the above findings. IMPRESSION: 1. No significant pulmonary embolus. 2. Cardiomegaly without failure. 3. Patchy bilateral airspace opacities compatible with COVID-19 pneumonia. Electronically Signed   By: CSan MorelleM.D.   On: 08/11/2019 06:50   Dg Chest Portable 1 View  Result Date: 08/10/2019 CLINICAL  DATA:  Shortness of breath, COVID positive EXAM: PORTABLE CHEST 1 VIEW COMPARISON:  None. FINDINGS: Streaky bibasilar atelectasis. No consolidation or effusion. Normal heart size. No pneumothorax. IMPRESSION: Streaky basilar atelectasis. Electronically Signed   By: Donavan Foil Murphy.   On: 08/10/2019 23:01    Micro Results     Recent Results (from the past 240 hour(s))  Novel Coronavirus, NAA ([facility])     Status: None   Collection Time: 08/08/19 12:00 AM   Specimen: Nasopharyngeal(NP) swabs in vial transport medium  Result Value Ref Range Status   SARS-CoV-2, NAA Positive  Final     Today   Subjective    [place] today has no headache,no chest abdominal pain,no new weakness tingling or numbness, feels much better wants to go home today.    Objective   Blood pressure 133/89, pulse 83, temperature 97.9 F (36.6 C), temperature source Oral, resp. rate 16, height '5\' 6"'$  (1.676 m), weight (!) 139.6 kg, last menstrual period 07/27/2019, SpO2 94 %.   Intake/Output Summary (Last 24 hours) at 08/15/2019 0928 Last data filed at 08/15/2019 0700 Gross per 24 hour  Intake 1602.56 ml  Output 300 ml  Net 1302.56 ml    Exam  Awake Alert,   No new F.N deficits, Normal affect [place].AT,PERRAL Supple Neck,No JVD, No cervical lymphadenopathy appriciated.  Symmetrical Chest wall movement, Good air movement bilaterally, CTAB RRR,No Gallops,Rubs or new Murmurs, No Parasternal Heave +ve B.Sounds, Abd Soft, Non tender, No organomegaly appriciated, No rebound -guarding or rigidity. No Cyanosis, Clubbing or edema, No new Rash or bruise   Data Review   CBC w Diff:  Lab Results  Component Value Date   WBC 5.2 08/15/2019   HGB 11.6 (L) 08/15/2019   HCT 36.8 08/15/2019   PLT 352 08/15/2019   LYMPHOPCT 38 08/15/2019   MONOPCT 8 08/15/2019   EOSPCT 0 08/15/2019   BASOPCT 0 08/15/2019    CMP:  Lab Results  Component Value Date   NA 139 08/15/2019   K 3.6 08/15/2019   CL 102 08/15/2019   CO2 25 08/15/2019   BUN 13 08/15/2019   CREATININE 0.69 08/15/2019   PROT 6.6 08/15/2019   ALBUMIN 2.9 (L) 08/15/2019   BILITOT 0.4 08/15/2019   ALKPHOS 38 08/15/2019   AST 36 08/15/2019   ALT 68 (H) 08/15/2019  .   Total Time in preparing paper work, data evaluation and todays exam - 39 minutes  Lauren Murphy on 08/15/2019 at 9:28 AM  [facility]   Office  [phone]

## 2019-08-15 NOTE — Telephone Encounter (Signed)
Called pt to schedule virtual visit for hospital follow up in one week. Patient wanted to have xray done first. I explained that patient would need to have virtual visit with Aldona Bar first and then she would order the xray if needed. Patient said that she would give [place] a call back.

## 2019-08-15 NOTE — Plan of Care (Signed)
  Problem: Education: Goal: Knowledge of risk factors and measures for prevention of condition will improve Outcome: Progressing   

## 2019-08-15 NOTE — Discharge Instructions (Signed)
Follow with Primary MD Jarold Motto, PA in 7 days get an outpatient Echocardiogram scheduled by your PCP.  Get CBC, CMP, 2 view Chest X ray -  checked next visit within 1 week by Primary MD    Activity: As tolerated with Full fall precautions use walker/cane & assistance as needed  Disposition Home    Diet: Heart Healthy  Low Carb,  Special Instructions: If you have smoked or chewed Tobacco  in the last 2 yrs please stop smoking, stop any regular Alcohol  and or any Recreational drug use.  On your next visit with your primary care physician please Get Medicines reviewed and adjusted.  Please request your Prim.MD to go over all Hospital Tests and Procedure/Radiological results at the follow up, please get all Hospital records sent to your Prim MD by signing hospital release before you go home.  If you experience worsening of your admission symptoms, develop shortness of breath, life threatening emergency, suicidal or homicidal thoughts you must seek medical attention immediately by calling 911 or calling your MD immediately  if symptoms less severe.  You Must read complete instructions/literature along with all the possible adverse reactions/side effects for all the Medicines you take and that have been prescribed to you. Take any new Medicines after you have completely understood and accpet all the possible adverse reactions/side effects.                                                                           [facility]                            [address]                            [place], [place] [address]      Lauren Murphy was admitted to the Hospital on 08/10/2019 and Discharged  08/15/2019 and should be excused from work/school   for 21 days starting from date -  08/10/2019 , may return to work/school without any restrictions.  Call Susa Raring MD, [facility]  [phone] with questions.  Susa Raring M.D on 08/15/2019,at 9:26  AM  [facility]   Office  [phone]        Person Under Monitoring Name: Lauren Murphy  Location: [address] [place] [address]   Infection Prevention Recommendations for Individuals Confirmed to have, or Being Evaluated for, 2019 Novel Coronavirus (COVID-19) Infection Who Receive Care at Home  Individuals who are confirmed to have, or are being evaluated for, COVID-19 should follow the prevention steps below until a healthcare provider or local or state health department says they can return to normal activities.  Stay home except to get medical care You should restrict activities outside your home, except for getting medical care. Do not go to work, school, or public areas, and do not use public transportation or taxis.  Call ahead before visiting your doctor Before your medical appointment, call the healthcare provider and tell them that you have, or are being evaluated for, COVID-19 infection. This will help the healthcare providers office take steps to keep other people  from getting infected. Ask your healthcare provider to call the local or state health department.  Monitor your symptoms Seek prompt medical attention if your illness is worsening (e.g., difficulty breathing). Before going to your medical appointment, call the healthcare provider and tell them that you have, or are being evaluated for, COVID-19 infection. Ask your healthcare provider to call the local or state health department.  Wear a facemask You should wear a facemask that covers your nose and mouth when you are in the same room with other people and when you visit a healthcare provider. People who live with or visit you should also wear a facemask while they are in the same room with you.  Separate yourself from other people in your home As much as possible, you should stay in a different room from other people in your home. Also, you should use a separate bathroom, if  available.  Avoid sharing household items You should not share dishes, drinking glasses, cups, eating utensils, towels, bedding, or other items with other people in your home. After using these items, you should wash them thoroughly with soap and water.  Cover your coughs and sneezes Cover your mouth and nose with a tissue when you cough or sneeze, or you can cough or sneeze into your sleeve. Throw used tissues in a lined trash can, and immediately wash your hands with soap and water for at least 20 seconds or use an alcohol-based hand rub.  Wash your [employer] your hands often and thoroughly with soap and water for at least 20 seconds. You can use an alcohol-based hand sanitizer if soap and water are not available and if your hands are not visibly dirty. Avoid touching your eyes, nose, and mouth with unwashed hands.   Prevention Steps for Caregivers and Household Members of Individuals Confirmed to have, or Being Evaluated for, COVID-19 Infection Being Cared for in the Home  If you live with, or provide care at home for, a person confirmed to have, or being evaluated for, COVID-19 infection please follow these guidelines to prevent infection:  Follow healthcare providers instructions Make sure that you understand and can help the patient follow any healthcare provider instructions for all care.  Provide for the patients basic needs You should help the patient with basic needs in the home and provide support for getting groceries, prescriptions, and other personal needs.  Monitor the patients symptoms If they are getting sicker, call his or her medical provider and tell them that the patient has, or is being evaluated for, COVID-19 infection. This will help the healthcare providers office take steps to keep other people from getting infected. Ask the healthcare provider to call the local or state health department.  Limit the number of people who have contact with the  patient  If possible, have only one caregiver for the patient.  Other household members should stay in another home or place of residence. If this is not possible, they should stay  in another room, or be separated from the patient as much as possible. Use a separate bathroom, if available.  Restrict visitors who do not have an essential need to be in the home.  Keep older adults, very young children, and other sick people away from the patient Keep older adults, very young children, and those who have compromised immune systems or chronic health conditions away from the patient. This includes people with chronic heart, lung, or kidney conditions, diabetes, and cancer.  Ensure good ventilation Make sure  that shared spaces in the home have good air flow, such as from an air conditioner or an opened window, weather permitting.  Wash your hands often  Wash your hands often and thoroughly with soap and water for at least 20 seconds. You can use an alcohol based hand sanitizer if soap and water are not available and if your hands are not visibly dirty.  Avoid touching your eyes, nose, and mouth with unwashed hands.  Use disposable paper towels to dry your hands. If not available, use dedicated cloth towels and replace them when they become wet.  Wear a facemask and gloves  Wear a disposable facemask at all times in the room and gloves when you touch or have contact with the patients blood, body fluids, and/or secretions or excretions, such as sweat, saliva, sputum, nasal mucus, vomit, urine, or feces.  Ensure the mask fits over your nose and mouth tightly, and do not touch it during use.  Throw out disposable facemasks and gloves after using them. Do not reuse.  Wash your hands immediately after removing your facemask and gloves.  If your personal clothing becomes contaminated, carefully remove clothing and launder. Wash your hands after handling contaminated clothing.  Place all used  disposable facemasks, gloves, and other waste in a lined container before disposing them with other household waste.  Remove gloves and wash your hands immediately after handling these items.  Do not share dishes, glasses, or other household items with the patient  Avoid sharing household items. You should not share dishes, drinking glasses, cups, eating utensils, towels, bedding, or other items with a patient who is confirmed to have, or being evaluated for, COVID-19 infection.  After the person uses these items, you should wash them thoroughly with soap and water.  Wash laundry thoroughly  Immediately remove and wash clothes or bedding that have blood, body fluids, and/or secretions or excretions, such as sweat, saliva, sputum, nasal mucus, vomit, urine, or feces, on them.  Wear gloves when handling laundry from the patient.  Read and follow directions on labels of laundry or clothing items and detergent. In general, wash and dry with the warmest temperatures recommended on the label.  Clean all areas the individual has used often  Clean all touchable surfaces, such as counters, tabletops, doorknobs, bathroom fixtures, toilets, phones, keyboards, tablets, and bedside tables, every day. Also, clean any surfaces that may have blood, body fluids, and/or secretions or excretions on them.  Wear gloves when cleaning surfaces the patient has come in contact with.  Use a diluted bleach solution (e.g., dilute bleach with 1 part bleach and 10 parts water) or a household disinfectant with a label that says EPA-registered for coronaviruses. To make a bleach solution at home, add 1 tablespoon of bleach to 1 quart (4 cups) of water. For a larger supply, add  cup of bleach to 1 gallon (16 cups) of water.  Read labels of cleaning products and follow recommendations provided on product labels. Labels contain instructions for safe and effective use of the cleaning product including precautions you should  take when applying the product, such as wearing gloves or eye protection and making sure you have good ventilation during use of the product.  Remove gloves and wash hands immediately after cleaning.  Monitor yourself for signs and symptoms of illness Caregivers and household members are considered close contacts, should monitor their health, and will be asked to limit movement outside of the home to the extent possible. Follow the monitoring steps  for close contacts listed on the symptom monitoring form.   ? If you have additional questions, contact your local health department or call the epidemiologist on call at [phone] (available 24/7). ? This guidance is subject to change. For the most up-to-date guidance from [facility], please refer to their website: TripMetro.huhttps://www.cdc.gov/coronavirus/2019-ncov/hcp/guidance-prevent-spread.html

## 2019-08-22 ENCOUNTER — Telehealth: Payer: Self-pay | Admitting: Physician Assistant

## 2019-08-22 ENCOUNTER — Other Ambulatory Visit: Payer: Self-pay

## 2019-08-22 NOTE — Telephone Encounter (Signed)
Spoke to pt, explained to her that she can not be seen in the office till 21 days past hospitalization which would be anytime after 12/28. Pt verbalized understanding and said how do I get chest xray done. Asked pt if cough is better or worse? Are you coughing anything up? Pt said cough is better, expectorating yellow sputum. Pt denies fever, chills or SOB. Pt said the only other thing is legs are still sore. Told pt I will let Aldona Bar know about the cough and I will get back to you about what she wants you to do. PT verbalized understanding.

## 2019-08-22 NOTE — Telephone Encounter (Signed)
Spoke to pt told her per Aldona Bar since her cough is no worse and she has no SOB, she can wait until her visit with [place] the week of 12/28 for chest xray. We can obtain labs and discuss ordering a sleep study and echo at that time. Pt verbalized understanding. Told her If worsening symptoms in the meantime, please let us know and we can try to get her into pulmonary other wise you can schedule week of 12/28. Pt verbalized understanding and will call back to schedule.

## 2019-08-22 NOTE — Telephone Encounter (Signed)
Patient was scheduled a hospital f/u for tomorrow and when asked if she was still experiencing any symptoms; she said yes to the cough. Tried to offer virtual visit, she declined and said the hospital wants her to have a few tests/xrays done.

## 2019-08-22 NOTE — Telephone Encounter (Signed)
Since her cough is no worse and she has no SOB, she can wait until her visit with [place] the week of 12/28 for chest xray.  We can obtain labs and discuss ordering a sleep study and echo at that time.  If worsening symptoms in the meantime, please let us know and we can try to get her into pulmonary.

## 2019-08-22 NOTE — Telephone Encounter (Signed)
Lauren Murphy please see message and advise. She needs chest xray to follow up on pneumonia, labs, sleep study and Echo per discharge papers.

## 2019-08-23 ENCOUNTER — Telehealth: Payer: Self-pay | Admitting: *Deleted

## 2019-08-23 ENCOUNTER — Inpatient Hospital Stay: Payer: BC Managed Care – PPO | Admitting: Physician Assistant

## 2019-08-23 DIAGNOSIS — Z0279 Encounter for issue of other medical certificate: Secondary | ICD-10-CM

## 2019-08-23 NOTE — Telephone Encounter (Signed)
Received FMLA forms. Samantha completed, copy made for pt's record and Madelyn given the originals to contact pt.

## 2019-09-06 ENCOUNTER — Other Ambulatory Visit: Payer: Self-pay

## 2019-09-06 ENCOUNTER — Ambulatory Visit (INDEPENDENT_AMBULATORY_CARE_PROVIDER_SITE_OTHER): Payer: BC Managed Care – PPO | Admitting: Physician Assistant

## 2019-09-06 ENCOUNTER — Encounter: Payer: Self-pay | Admitting: Physician Assistant

## 2019-09-06 ENCOUNTER — Ambulatory Visit (INDEPENDENT_AMBULATORY_CARE_PROVIDER_SITE_OTHER): Payer: BC Managed Care – PPO

## 2019-09-06 VITALS — BP 136/90 | HR 106 | Temp 98.1°F | Ht 66.0 in

## 2019-09-06 DIAGNOSIS — I517 Cardiomegaly: Secondary | ICD-10-CM

## 2019-09-06 DIAGNOSIS — Z13220 Encounter for screening for lipoid disorders: Secondary | ICD-10-CM | POA: Diagnosis not present

## 2019-09-06 DIAGNOSIS — U071 COVID-19: Secondary | ICD-10-CM

## 2019-09-06 DIAGNOSIS — Z136 Encounter for screening for cardiovascular disorders: Secondary | ICD-10-CM

## 2019-09-06 DIAGNOSIS — R29818 Other symptoms and signs involving the nervous system: Secondary | ICD-10-CM | POA: Diagnosis not present

## 2019-09-06 DIAGNOSIS — J9601 Acute respiratory failure with hypoxia: Secondary | ICD-10-CM

## 2019-09-06 DIAGNOSIS — Z8619 Personal history of other infectious and parasitic diseases: Secondary | ICD-10-CM | POA: Diagnosis not present

## 2019-09-06 DIAGNOSIS — E1165 Type 2 diabetes mellitus with hyperglycemia: Secondary | ICD-10-CM

## 2019-09-06 LAB — COMPREHENSIVE METABOLIC PANEL
ALT: 14 U/L (ref 0–35)
AST: 15 U/L (ref 0–37)
Albumin: 3.9 g/dL (ref 3.5–5.2)
Alkaline Phosphatase: 43 U/L (ref 39–117)
BUN: 9 mg/dL (ref 6–23)
CO2: 24 mEq/L (ref 19–32)
Calcium: 8.7 mg/dL (ref 8.4–10.5)
Chloride: 103 mEq/L (ref 96–112)
Creatinine, Ser: 0.76 mg/dL (ref 0.40–1.20)
GFR: 102.18 mL/min (ref >60.00)
Glucose, Bld: 166 mg/dL — ABNORMAL HIGH (ref 70–99)
Potassium: 4 mEq/L (ref 3.5–5.1)
Sodium: 135 mEq/L (ref 135–145)
Total Bilirubin: 0.3 mg/dL (ref 0.2–1.2)
Total Protein: 6.9 g/dL (ref 6.0–8.3)

## 2019-09-06 LAB — LIPID PANEL
Cholesterol: 201 mg/dL — ABNORMAL HIGH (ref 0–200)
HDL: 52.7 mg/dL (ref >39.00)
NonHDL: 148.61
Total CHOL/HDL Ratio: 4
Triglycerides: 251 mg/dL — ABNORMAL HIGH (ref 0.0–149.0)
VLDL: 50.2 mg/dL — ABNORMAL HIGH (ref 0.0–40.0)

## 2019-09-06 LAB — LDL CHOLESTEROL, DIRECT: Direct LDL: 99 mg/dL

## 2019-09-06 NOTE — Progress Notes (Signed)
Lauren Murphy is a 38 y.o. female is here for hospital follow up.  I acted as a Education administrator for [employer], PA-C Anselmo Pickler, LPN  History of Present Illness:   Chief Complaint  Patient presents with  . Hospitalization Follow-up    HPI   Hospital f/u Pt here today for f/u from hospital admission on 12/2. Pt dx with COVID-19 on 08/08/2019. Admitted to [facility] 08/10/19 - 08/15/19. CT angiogram did not show PE, but did show cardiomegaly, which she was recommended to have outpatient echo for further evaluation of. Received IV steroids and remdesivir. During hospitalization she did have some issues where her oxygen dropped during sleeping and she was recommended to have an outpatient sleep study.  She is having ongoing fatigue as she recovers from her illness. She [facility] if she is negative.  Denies: chest pain, lower leg swelling, calf pain, severe SOB  She was also given insulin while in the hospital and she is now motivated to trial Trulicity.  Lab Results  Component Value Date   HGBA1C 7.7 (H) 08/10/2019     Health Maintenance Due  Topic Date Due  . PNEUMOCOCCAL POLYSACCHARIDE VACCINE AGE 84-64 HIGH RISK  01/18/1982  . FOOT EXAM  01/18/1990  . OPHTHALMOLOGY EXAM  01/18/1990  . URINE MICROALBUMIN  01/18/1990  . INFLUENZA VACCINE  04/09/2019    Past Medical History:  Diagnosis Date  . Cervical high risk HPV (human papillomavirus) test positive 2014, 2015   Colposcopy 2016 - negative.  . CIN I (cervical intraepithelial neoplasia I) 2006, 2008, 2010   colposcopic biopsies - [place]  . COVID-19   . Diabetes mellitus without complication ([place])   . Febrile seizures ([place])    as a child--pt.states epileptic seizures  . HSV-1 (herpes simplex virus 1) infection      Social History   Socioeconomic History  . Marital status: Single    Spouse name: Not on file  . Number of children: Not on  file  . Years of education: Not on file  . Highest education level: Not on file  Occupational History  . Not on file  Tobacco Use  . Smoking status: Never Smoker  . Smokeless tobacco: Never Used  Substance and Sexual Activity  . Alcohol use: Not Currently    Comment: rarely  . Drug use: Never  . Sexual activity: Yes    Birth control/protection: Pill    Comment: Cryselle  Other Topics Concern  . Not on file  Social History Narrative  . Not on file   Social Determinants of Health   Financial Resource Strain:   . Difficulty of Paying Living Expenses: Not on file  Food Insecurity:   . Worried About Charity fundraiser in the Last Year: Not on file  . Ran Out of Food in the Last Year: Not on file  Transportation Needs:   . Lack of Transportation (Medical): Not on file  . Lack of Transportation (Non-Medical): Not on file  Physical Activity: Insufficiently Active  . Days of Exercise per Week: 1 day  . Minutes of Exercise per Session: 30 min  Stress:   . Feeling of Stress : Not on file  Social Connections:   . Frequency of Communication with Friends and Family: Not on file  . Frequency of Social Gatherings with Friends and Family: Not on file  . Attends Religious Services: Not on file  . Active  Member of Clubs or Organizations: Not on file  . Attends BankerClub or Organization Meetings: Not on file  . Marital Status: Not on file  Intimate Partner Violence:   . Fear of Current or Ex-Partner: Not on file  . Emotionally Abused: Not on file  . Physically Abused: Not on file  . Sexually Abused: Not on file    Past Surgical History:  Procedure Laterality Date  . CHOLECYSTECTOMY  2003    Family History  Problem Relation Age of Onset  . Diabetes Mother   . Thyroid disease Mother        hypothyroid  . Hypertension Father   . Diabetes Sister   . Diabetes Maternal Grandmother   . Stroke Maternal Grandmother   . Hypertension Paternal Grandmother   . Stroke Paternal Grandmother    . Diabetes Paternal Grandfather   . Glaucoma Paternal Grandfather   . Diabetes Sister   . Colon cancer Neg Hx   . Stomach cancer Neg Hx   . Pancreatic cancer Neg Hx   . Pancreatic disease Neg Hx     PMHx, SurgHx, SocialHx, FamHx, Medications, and Allergies were reviewed in the Visit Navigator and updated as appropriate.   Patient Active Problem List   Diagnosis Date Noted  . Pneumonia due to COVID-19 virus 08/11/2019  . Hypoxia 08/11/2019  . Uncontrolled type 2 diabetes mellitus with hyperglycemia (HCC) 08/11/2019  . Cardiomegaly 08/11/2019  . Hyperlipidemia 08/11/2019  . Transaminitis 08/11/2019  . Diabetes mellitus without complication (HCC) 02/09/2019  . Chronic pain of right knee 12/18/2018    Social History   Tobacco Use  . Smoking status: Never Smoker  . Smokeless tobacco: Never Used  Substance Use Topics  . Alcohol use: Not Currently    Comment: rarely  . Drug use: Never    Current Medications and Allergies:    Current Outpatient Medications:  .  acetaminophen (TYLENOL) 500 MG tablet, Take 1,000 mg by mouth every 6 (six) hours as needed for mild pain or headache., Disp: , Rfl:  .  albuterol (VENTOLIN HFA) 108 (90 Base) MCG/ACT inhaler, Inhale 2 puffs into the lungs every 6 (six) hours as needed for wheezing or shortness of breath., Disp: 6.7 g, Rfl: 0 .  colesevelam (WELCHOL) 625 MG tablet, Take 2 tablets (1,250 mg total) by mouth daily with breakfast. (Patient taking differently: Take 1,250 mg by mouth daily as needed. ), Disp: 60 tablet, Rfl: 1 .  norgestrel-ethinyl estradiol (CRYSELLE-28) 0.3-30 MG-MCG tablet, Take 1 tablet by mouth daily., Disp: , Rfl:  .  nystatin (MYCOSTATIN/NYSTOP) powder, Apply topically 3 (three) times daily. Apply to affected area for up to 7 days (Patient taking differently: Apply topically 3 (three) times daily as needed (rash). Apply to affected area for up to 7 days), Disp: 30 g, Rfl: 2 .  traZODone (DESYREL) 50 MG tablet, Take 25-50  mg by mouth at bedtime as needed for sleep. , Disp: , Rfl:   No Known Allergies  Review of Systems   ROS  Negative unless otherwise specified per HPI.  Vitals:   Vitals:   09/06/19 1353  BP: 136/90  Pulse: (!) 106  Temp: 98.1 F (36.7 C)  TempSrc: Temporal  SpO2: 97%  Height: 5\' 6"  (1.676 m)     Body mass index is 49.68 kg/m.   Physical Exam:    Physical Exam Vitals and nursing note reviewed.  Constitutional:      General: She is not in acute distress.    Appearance: She  is well-developed. She is not ill-appearing or toxic-appearing.  Cardiovascular:     Rate and Rhythm: Normal rate and regular rhythm.     Pulses: Normal pulses.     Heart sounds: Normal heart sounds, S1 normal and S2 normal.     Comments: No LE edema Pulmonary:     Effort: Pulmonary effort is normal.     Breath sounds: Normal breath sounds.  Skin:    General: Skin is warm and dry.  Neurological:     Mental Status: She is alert.     GCS: GCS eye subscore is 4. GCS verbal subscore is 5. GCS motor subscore is 6.  Psychiatric:        Speech: Speech normal.        Behavior: Behavior normal. Behavior is cooperative.      Assessment and Plan:    Tiombe was seen today for hospitalization follow-up.  Diagnoses and all orders for this visit:  Acute hypoxemic respiratory failure due to COVID-19 [facility]) She is in no apparent distress at this time. I recommended that we refer to pulmonology to make sure she continues to improve as anticipated. I have also placed her on low dose ASA given her OCP use and uncontrolled DM; lipid panel pending. Worsening precautions advised and provided on AVS. Repeat CXR today. -     Comprehensive metabolic panel -     DG Chest 2 View; Future -     ECHOCARDIOGRAM COMPLETE; Future -     Ambulatory referral to Sleep Studies -     [facility]  Cardiomegaly Echo for further evaluation. -     DG Chest 2 View; Future -     ECHOCARDIOGRAM COMPLETE; Future  Suspected  sleep apnea -     Ambulatory referral to Sleep Studies  Encounter for lipid screening for cardiovascular disease -     Lipid panel  Uncontrolled type 2 diabetes mellitus with hyperglycemia (HCC) She is agreeable to trialing Trulicity. 2-week sample of 0.75 mg given in office today. Follow-up in 2 weeks to discuss medication and to see if she would like to proceed.  . Reviewed expectations re: course of current medical issues. . Discussed self-management of symptoms. . Outlined signs and symptoms indicating need for more acute intervention. . Patient verbalized understanding and all questions were answered. . See orders for this visit as documented in the electronic medical record. . Patient received an After Visit Summary.  CMA or LPN served as scribe during this visit. History, Physical, and Plan performed by medical provider. The above documentation has been reviewed and is accurate and complete.  Jarold Motto, PA-C [place] [place] 09/06/2019  Follow-up: No follow-ups on file.

## 2019-09-06 NOTE — Patient Instructions (Signed)
It was great to see you!  Start low dose 81 mg aspirin. I will be in touch with lab results.  You will be contacted about your referral to pulmonary, sleep studies and for your outpatient imaging study for your heart.  If you develop severe shortness of breath, uncontrolled fevers, coughing up blood, confusion, chest pain, or signs of dehydration (such as significantly decreased urine amounts or dizziness with standing) please CALL the ER and then GO to the ER.

## 2019-09-13 ENCOUNTER — Encounter: Payer: Self-pay | Admitting: Physician Assistant

## 2019-09-15 ENCOUNTER — Other Ambulatory Visit: Payer: Self-pay

## 2019-09-15 ENCOUNTER — Encounter: Payer: Self-pay | Admitting: Pulmonary Disease

## 2019-09-15 ENCOUNTER — Ambulatory Visit (HOSPITAL_COMMUNITY): Payer: BC Managed Care – PPO | Attending: Physician Assistant

## 2019-09-15 ENCOUNTER — Ambulatory Visit (INDEPENDENT_AMBULATORY_CARE_PROVIDER_SITE_OTHER): Payer: BC Managed Care – PPO | Admitting: Pulmonary Disease

## 2019-09-15 VITALS — BP 134/80 | HR 94 | Temp 97.2°F | Ht 67.0 in | Wt 304.4 lb

## 2019-09-15 DIAGNOSIS — J9601 Acute respiratory failure with hypoxia: Secondary | ICD-10-CM | POA: Diagnosis not present

## 2019-09-15 DIAGNOSIS — U071 COVID-19: Secondary | ICD-10-CM | POA: Diagnosis not present

## 2019-09-15 DIAGNOSIS — I517 Cardiomegaly: Secondary | ICD-10-CM | POA: Insufficient documentation

## 2019-09-15 DIAGNOSIS — G4733 Obstructive sleep apnea (adult) (pediatric): Secondary | ICD-10-CM | POA: Diagnosis not present

## 2019-09-15 LAB — ECHOCARDIOGRAM COMPLETE
Height: 67 in
Weight: 4870.4 oz

## 2019-09-15 NOTE — Progress Notes (Signed)
Subjective:    Patient ID: Lauren Murphy, female    DOB: 1981-05-13, 39 y.o.   MRN: [id number]  Patient being seen for possible obstructive sleep apnea  Significant history of snoring Recently had Covid infection Recovering-still has short shortness of breath and coughing Recently tested negative  History of snoring over many years No headaches in the morning Occasional dryness of her mouth She does have choking and gasping respirations at night Not able to sleep on a diet because she has more symptoms on her back Usually sleeps laterally Usually goes to bed between 10 PM and 12 AM 30 minutes or less to fall asleep Wakes up several times during the night Final awakening time between 6 and 7 AM  She is gained about 20 pounds in the last couple years  No history of hypertension, does have a history of diabetes  No family history of obstructive sleep apnea  She feels her memory has become sketchy recently  Non-smoker  Past Medical History:  Diagnosis Date  . Cervical high risk HPV (human papillomavirus) test positive 2014, 2015   Colposcopy 2016 - negative.  . CIN I (cervical intraepithelial neoplasia I) 2006, 2008, 2010   colposcopic biopsies - [place]  . COVID-19   . Diabetes mellitus without complication (HCC)   . Febrile seizures (HCC)    as a child--pt.states epileptic seizures  . HSV-1 (herpes simplex virus 1) infection    Family History  Problem Relation Age of Onset  . Diabetes Mother   . Thyroid disease Mother        hypothyroid  . Hypertension Father   . Diabetes Sister   . Diabetes Maternal Grandmother   . Stroke Maternal Grandmother   . Hypertension Paternal Grandmother   . Stroke Paternal Grandmother   . Diabetes Paternal Grandfather   . Glaucoma Paternal Grandfather   . Diabetes Sister   . Colon cancer Neg Hx   . Stomach cancer Neg Hx   . Pancreatic cancer Neg Hx   . Pancreatic disease Neg Hx    Social History   Socioeconomic History  .  Marital status: Single    Spouse name: Not on file  . Number of children: Not on file  . Years of education: Not on file  . Highest education level: Not on file  Occupational History  . Not on file  Tobacco Use  . Smoking status: Never Smoker  . Smokeless tobacco: Never Used  Substance and Sexual Activity  . Alcohol use: Not Currently    Comment: rarely  . Drug use: Never  . Sexual activity: Yes    Birth control/protection: Pill    Comment: Cryselle  Other Topics Concern  . Not on file  Social History Narrative  . Not on file   Social Determinants of Health   Financial Resource Strain:   . Difficulty of Paying Living Expenses: Not on file  Food Insecurity:   . Worried About Programme researcher, broadcasting/film/video in the Last Year: Not on file  . Ran Out of Food in the Last Year: Not on file  Transportation Needs:   . Lack of Transportation (Medical): Not on file  . Lack of Transportation (Non-Medical): Not on file  Physical Activity: Insufficiently Active  . Days of Exercise per Week: 1 day  . Minutes of Exercise per Session: 30 min  Stress:   . Feeling of Stress : Not on file  Social Connections:   . Frequency of Communication with Friends and Family:  Not on file  . Frequency of Social Gatherings with Friends and Family: Not on file  . Attends Religious Services: Not on file  . Active Member of Clubs or Organizations: Not on file  . Attends Banker Meetings: Not on file  . Marital Status: Not on file  Intimate Partner Violence:   . Fear of Current or Ex-Partner: Not on file  . Emotionally Abused: Not on file  . Physically Abused: Not on file  . Sexually Abused: Not on file   Review of Systems  Constitutional: Negative for fever and unexpected weight change.  HENT: Negative for congestion, dental problem, ear pain, nosebleeds, postnasal drip, rhinorrhea, sinus pressure, sneezing, sore throat and trouble swallowing.   Eyes: Negative for redness and itching.  Respiratory:  Positive for cough and shortness of breath. Negative for chest tightness and wheezing.   Cardiovascular: Negative for palpitations and leg swelling.  Gastrointestinal: Negative for nausea and vomiting.  Genitourinary: Negative for dysuria.  Musculoskeletal: Negative for joint swelling.  Skin: Negative for rash.  Allergic/Immunologic: Negative.  Negative for environmental allergies, food allergies and immunocompromised state.  Neurological: Positive for headaches.  Hematological: Does not bruise/bleed easily.  Psychiatric/Behavioral: Negative for dysphoric mood. The patient is nervous/anxious.   All other systems reviewed and are negative.      Objective:   Physical Exam Constitutional:      Appearance: She is obese.  HENT:     Head: Normocephalic.     Nose: Nose normal. No congestion.     Mouth/Throat:     Mouth: Mucous membranes are moist.     Comments: Mallampati 4, crowded oropharynx Eyes:     Extraocular Movements: Extraocular movements intact.     Pupils: Pupils are equal, round, and reactive to light.  Cardiovascular:     Rate and Rhythm: Normal rate and regular rhythm.     Pulses: Normal pulses.     Heart sounds: No murmur. No friction rub.  Pulmonary:     Effort: Pulmonary effort is normal. No respiratory distress.     Breath sounds: Normal breath sounds. No stridor. No wheezing or rhonchi.  Musculoskeletal:        General: No swelling. Normal range of motion.     Cervical back: Normal range of motion and neck supple.  Skin:    General: Skin is warm.     Coloration: Skin is not jaundiced or pale.  Neurological:     General: No focal deficit present.     Mental Status: She is alert and oriented to person, place, and time.  Psychiatric:        Mood and Affect: Mood normal.    Vitals:   09/15/19 1147  BP: 134/80  Pulse: 94  Temp: (!) 97.2 F (36.2 C)  SpO2: 100%   Results of the Epworth flowsheet 09/15/2019  Sitting and reading 1  Watching TV 2  Sitting,  inactive in a public place (e.g. a theatre or a meeting) 1  As a passenger in a car for an hour without a break 1  Lying down to rest in the afternoon when circumstances permit 0  Sitting and talking to someone 0  Sitting quietly after a lunch without alcohol 1  In a car, while stopped for a few minutes in traffic 0  Total score 6      Assessment & Plan:  .  Moderate probability of significant obstructive sleep apnea  .  Snoring  .  Nonrestorative sleep -Likely related  to multiple awakenings related to sleep disordered breathing  .  Daytime sleepiness -Multiple awakenings related to sleep disordered breathing  .  Morbid obesity -May be related to chronic fatigue  .  Recent Covid infection .  Shortness of breath with activity-this is improving  Pathophysiology of sleep disordered breathing discussed with the patient  Treatment options for sleep disordered breathing discussed with the patient  Risk associated with untreated sleep disordered breathing discussed with the patient including excessive daytime sleepiness, cardiovascular risk  Plan:   .  Schedule patient for home sleep study.  .  Weight loss efforts encouraged  .  We will follow-up in 3 months  .  Information material provided regarding sleep disordered breathing and effect on health  .  Encouraged to call with any significant concerns

## 2019-09-15 NOTE — Patient Instructions (Signed)
Moderate probability of significant obstructive sleep  We will set you up with a home sleep study  Update your results  CPAP therapy will be an option of treatment We will see you back in the office in about 3 months  Call with significant concerns Sleep Apnea Sleep apnea is a condition in which breathing pauses or becomes shallow during sleep. Episodes of sleep apnea usually last 10 seconds or longer, and they may occur as many as 20 times an hour. Sleep apnea disrupts your sleep and keeps your body from getting the rest that it needs. This condition can increase your risk of certain health problems, including:  Heart attack.  Stroke.  Obesity.  Diabetes.  Heart failure.  Irregular heartbeat. What are the causes? There are three kinds of sleep apnea:  Obstructive sleep apnea. This kind is caused by a blocked or collapsed airway.  Central sleep apnea. This kind happens when the part of the brain that controls breathing does not send the correct signals to the muscles that control breathing.  Mixed sleep apnea. This is a combination of obstructive and central sleep apnea. The most common cause of this condition is a collapsed or blocked airway. An airway can collapse or become blocked if:  Your throat muscles are abnormally relaxed.  Your tongue and tonsils are larger than normal.  You are overweight.  Your airway is smaller than normal. What increases the risk? You are more likely to develop this condition if you:  Are overweight.  Smoke.  Have a smaller than normal airway.  Are elderly.  Are female.  Drink alcohol.  Take sedatives or tranquilizers.  Have a family history of sleep apnea. What are the signs or symptoms? Symptoms of this condition include:  Trouble staying asleep.  Daytime sleepiness and tiredness.  Irritability.  Loud snoring.  Morning headaches.  Trouble concentrating.  Forgetfulness.  Decreased interest in sex.  Unexplained  sleepiness.  Mood swings.  Personality changes.  Feelings of depression.  Waking up often during the night to urinate.  Dry mouth.  Sore throat. How is this diagnosed? This condition may be diagnosed with:  A medical history.  A physical exam.  A series of tests that are done while you are sleeping (sleep study). These tests are usually done in a sleep lab, but they may also be done at home. How is this treated? Treatment for this condition aims to restore normal breathing and to ease symptoms during sleep. It may involve managing health issues that can affect breathing, such as high blood pressure or obesity. Treatment may include:  Sleeping on your side.  Using a decongestant if you have nasal congestion.  Avoiding the use of depressants, including alcohol, sedatives, and narcotics.  Losing weight if you are overweight.  Making changes to your diet.  Quitting smoking.  Using a device to open your airway while you sleep, such as: ? An oral appliance. This is a custom-made mouthpiece that shifts your lower jaw forward. ? A continuous positive airway pressure (CPAP) device. This device blows air through a mask when you breathe out (exhale). ? A nasal expiratory positive airway pressure (EPAP) device. This device has valves that you put into each nostril. ? A bi-level positive airway pressure (BPAP) device. This device blows air through a mask when you breathe in (inhale) and breathe out (exhale).  Having surgery if other treatments do not work. During surgery, excess tissue is removed to create a wider airway. It is important to  get treatment for sleep apnea. Without treatment, this condition can lead to:  High blood pressure.  Coronary artery disease.  In men, an inability to achieve or maintain an erection (impotence).  Reduced thinking abilities. Follow these instructions at home: Lifestyle  Make any lifestyle changes that your health care provider  recommends.  Eat a healthy, well-balanced diet.  Take steps to lose weight if you are overweight.  Avoid using depressants, including alcohol, sedatives, and narcotics.  Do not use any products that contain nicotine or tobacco, such as cigarettes, e-cigarettes, and chewing tobacco. If you need help quitting, ask your health care provider. General instructions  Take over-the-counter and prescription medicines only as told by your health care provider.  If you were given a device to open your airway while you sleep, use it only as told by your health care provider.  If you are having surgery, make sure to tell your health care provider you have sleep apnea. You may need to bring your device with you.  Keep all follow-up visits as told by your health care provider. This is important. Contact a health care provider if:  The device that you received to open your airway during sleep is uncomfortable or does not seem to be working.  Your symptoms do not improve.  Your symptoms get worse. Get help right away if:  You develop: ? Chest pain. ? Shortness of breath. ? Discomfort in your back, arms, or stomach.  You have: ? Trouble speaking. ? Weakness on one side of your body. ? Drooping in your face. These symptoms may represent a serious problem that is an emergency. Do not wait to see if the symptoms will go away. Get medical help right away. Call your local emergency services (911 in the [place] Do not drive yourself to the hospital. Summary  Sleep apnea is a condition in which breathing pauses or becomes shallow during sleep.  The most common cause is a collapsed or blocked airway.  The goal of treatment is to restore normal breathing and to ease symptoms during sleep. This information is not intended to replace advice given to you by your health care provider. Make sure you discuss any questions you have with your health care provider. Document Revised: 02/09/2019 Document  Reviewed: 04/20/2018 Elsevier Patient Education  [place].

## 2019-09-16 ENCOUNTER — Institutional Professional Consult (permissible substitution): Payer: BC Managed Care – PPO | Admitting: Pulmonary Disease

## 2019-09-25 ENCOUNTER — Other Ambulatory Visit: Payer: Self-pay | Admitting: Physician Assistant

## 2019-09-25 ENCOUNTER — Encounter: Payer: Self-pay | Admitting: Physician Assistant

## 2019-09-26 ENCOUNTER — Other Ambulatory Visit: Payer: Self-pay | Admitting: Physician Assistant

## 2019-09-26 MED ORDER — TRULICITY 0.75 MG/0.5ML ~~LOC~~ SOAJ: 0.7500 mg | SUBCUTANEOUS | 2 refills | Status: DC

## 2019-09-27 ENCOUNTER — Other Ambulatory Visit: Payer: Self-pay | Admitting: Physician Assistant

## 2019-09-27 ENCOUNTER — Encounter: Payer: Self-pay | Admitting: Physician Assistant

## 2019-09-27 DIAGNOSIS — S62637A Displaced fracture of distal phalanx of left little finger, initial encounter for closed fracture: Secondary | ICD-10-CM | POA: Diagnosis not present

## 2019-09-27 NOTE — Telephone Encounter (Signed)
Pt stopped by the office for sample of Trulicity and said pharmacy has been trying to get in touch with [place]. Told pt I have sent Rx back to them twice and told them can not tolerate Metformin. Told pt she will need to contact her insurance to see what else is covered that is cheaper. Pt verbalized understanding.

## 2019-09-27 NOTE — Telephone Encounter (Signed)
Pt requesting refill on Trazodone 50 mg, never filled by you.

## 2019-09-27 NOTE — Telephone Encounter (Signed)
Left message on voicemail to call office. Sample of Trulicity 0.75 mg in the refrigerator for pt.

## 2019-09-29 NOTE — Telephone Encounter (Signed)
PA started thru Covermymeds for Trulicity 0.75 mg weekly injection.  Key: [id number] Outcome Approvedtoday Effective from 09/29/2019 through 09/27/2020.

## 2019-09-29 NOTE — Telephone Encounter (Signed)
Spoke to pt told her I got PA approved for Trulicity for one year and I will call the pharmacy and let them know. Pt verbalized understanding.  Called [facility] pharmacy and spoke to [place] and told him Prior Berkley Harvey was approved for one year. Rosanne Ashing verbalized understanding and said he will try it.

## 2019-11-03 DIAGNOSIS — M79645 Pain in left finger(s): Secondary | ICD-10-CM | POA: Diagnosis not present

## 2019-11-03 DIAGNOSIS — S62637A Displaced fracture of distal phalanx of left little finger, initial encounter for closed fracture: Secondary | ICD-10-CM | POA: Diagnosis not present

## 2019-11-03 DIAGNOSIS — M24542 Contracture, left hand: Secondary | ICD-10-CM | POA: Diagnosis not present

## 2019-11-03 DIAGNOSIS — M25642 Stiffness of left hand, not elsewhere classified: Secondary | ICD-10-CM | POA: Diagnosis not present

## 2019-11-07 ENCOUNTER — Telehealth: Payer: Self-pay | Admitting: Gastroenterology

## 2019-11-07 MED ORDER — COLESEVELAM HCL 625 MG PO TABS: 1250.0000 mg | ORAL_TABLET | Freq: Every day | ORAL | 0 refills | Status: DC

## 2019-11-07 NOTE — Telephone Encounter (Signed)
One month supply given only until scheduled follow up visit

## 2019-11-08 DIAGNOSIS — H608X3 Other otitis externa, bilateral: Secondary | ICD-10-CM | POA: Diagnosis not present

## 2019-11-08 DIAGNOSIS — H6123 Impacted cerumen, bilateral: Secondary | ICD-10-CM | POA: Diagnosis not present

## 2019-11-09 ENCOUNTER — Telehealth: Payer: Self-pay | Admitting: Physician Assistant

## 2019-11-09 NOTE — Telephone Encounter (Signed)
Noted chart updated

## 2019-11-09 NOTE — Telephone Encounter (Signed)
Pt does not want flu shot.

## 2019-11-12 ENCOUNTER — Other Ambulatory Visit: Payer: Self-pay

## 2019-11-15 NOTE — Telephone Encounter (Signed)
I do not understand why are you sending this to me? Please advise. Thanks

## 2019-11-16 ENCOUNTER — Encounter: Payer: Self-pay | Admitting: Podiatry

## 2019-11-16 ENCOUNTER — Ambulatory Visit (INDEPENDENT_AMBULATORY_CARE_PROVIDER_SITE_OTHER): Payer: BC Managed Care – PPO | Admitting: Podiatry

## 2019-11-16 ENCOUNTER — Ambulatory Visit (INDEPENDENT_AMBULATORY_CARE_PROVIDER_SITE_OTHER): Payer: BC Managed Care – PPO

## 2019-11-16 ENCOUNTER — Other Ambulatory Visit: Payer: Self-pay

## 2019-11-16 VITALS — Temp 97.4°F

## 2019-11-16 DIAGNOSIS — M722 Plantar fascial fibromatosis: Secondary | ICD-10-CM

## 2019-11-16 MED ORDER — DICLOFENAC SODIUM 75 MG PO TBEC: 75.0000 mg | DELAYED_RELEASE_TABLET | Freq: Two times a day (BID) | ORAL | 2 refills | Status: DC

## 2019-11-16 NOTE — Patient Instructions (Signed)

## 2019-11-16 NOTE — Progress Notes (Signed)
   Subjective:    Patient ID: Lauren Murphy, female    DOB: August 29, 1981, 39 y.o.   MRN: [id number]  HPI    Review of Systems  All other systems reviewed and are negative.      Objective:   Physical Exam        Assessment & Plan:

## 2019-11-17 NOTE — Progress Notes (Signed)
Subjective:   Patient ID: Lauren Murphy, female   DOB: 39 y.o.   MRN: [id number]   HPI Patient presents stating she has very flat feet she has a painful heel right and she was in the hospital with COVID-19.  States it is been very sore has given her trouble on and off for a year and a half and sugar is under reasonable control but does have mild elevation A1c.   Review of Systems  All other systems reviewed and are negative.       Objective:  Physical Exam Vitals and nursing note reviewed.  Constitutional:      Appearance: She is well-developed.  Pulmonary:     Effort: Pulmonary effort is normal.  Musculoskeletal:        General: Normal range of motion.  Skin:    General: Skin is warm.  Neurological:     Mental Status: She is alert.     Neurovascular status intact muscle strength adequate range of motion within normal limits with exquisite discomfort plantar aspect right heel at the insertional point tendon calcaneus with inflammation fluid around the medial band and significant flatfoot deformity     Assessment:  Chronic plantar fasciitis right with inflammation fluid of the medial band     Plan:  H&P reviewed condition and I did sterile prep injected the fascia 3 mg Kenalog 5 mg Xylocaine discussed the importance of activity with her history of diabetes and recommended long-term orthotics but first I want to get her better.  Patient will be seen back to recheck fascial brace dispensed oral anti-inflammatories will be used that she is taken and patient will be seen back  X-rays indicate severe flatfoot deformity with plantar spur formation noted no indication of stress fracture

## 2019-11-23 ENCOUNTER — Other Ambulatory Visit: Payer: Self-pay

## 2019-11-23 ENCOUNTER — Ambulatory Visit: Payer: BC Managed Care – PPO

## 2019-11-23 DIAGNOSIS — G4733 Obstructive sleep apnea (adult) (pediatric): Secondary | ICD-10-CM

## 2019-11-23 DIAGNOSIS — R0683 Snoring: Secondary | ICD-10-CM | POA: Diagnosis not present

## 2019-11-24 DIAGNOSIS — R0683 Snoring: Secondary | ICD-10-CM | POA: Diagnosis not present

## 2019-11-25 ENCOUNTER — Telehealth: Payer: Self-pay | Admitting: Pulmonary Disease

## 2019-11-25 NOTE — Telephone Encounter (Signed)
Dr. Wynona Neat has reviewed the home sleep test this test was negative for sleep apnea.  Dr . Wynona Neat recommendation are encourage regular exercise,weight loss efforts, sleep in lateral position, and elevating the head of bed 30 degrees. Caution against driving when sleepy, and medications with sedative side effects.  If there is persistence of symptoms despite weight loss measure, consider an in lab polysomnogram.   Called and spoke with Patient.  Dr. Trena Platt results and recommendations given.  Understanding stated.  Nothing further at this time.

## 2019-12-01 ENCOUNTER — Ambulatory Visit: Payer: BC Managed Care – PPO | Admitting: Podiatry

## 2019-12-02 ENCOUNTER — Ambulatory Visit (INDEPENDENT_AMBULATORY_CARE_PROVIDER_SITE_OTHER): Payer: BC Managed Care – PPO | Admitting: Gastroenterology

## 2019-12-02 ENCOUNTER — Encounter: Payer: Self-pay | Admitting: Gastroenterology

## 2019-12-02 VITALS — BP 126/70 | HR 98 | Temp 97.5°F | Ht 67.0 in

## 2019-12-02 DIAGNOSIS — K529 Noninfective gastroenteritis and colitis, unspecified: Secondary | ICD-10-CM | POA: Diagnosis not present

## 2019-12-02 MED ORDER — COLESEVELAM HCL 625 MG PO TABS: 1250.0000 mg | ORAL_TABLET | Freq: Every day | ORAL | 11 refills | Status: DC

## 2019-12-02 NOTE — Patient Instructions (Signed)
If you are age 40 or older, your body mass index should be between 23-30. Your Body mass index is 47.68 kg/m. If this is out of the aforementioned range listed, please consider follow up with your Primary Care Provider.  If you are age 32 or younger, your body mass index should be between 19-25. Your Body mass index is 47.68 kg/m. If this is out of the aformentioned range listed, please consider follow up with your Primary Care Provider.   Follow up in 1 year or sooner if needed.  It was a pleasure to see you today!  Dr. Myrtie Neither

## 2019-12-02 NOTE — Progress Notes (Signed)
     Lauren Murphy GI Progress Note  Chief Complaint: Bile acid diarrhea  Subjective  History: Seen in telemedicine consult June 2020 with chronic symptoms somewhat suggestive of IBS, but also predominantly bile acid diarrhea, occurring since distant cholecystectomy.  Lauren Murphy has been doing well since I last spoke with her.  She started the WelChol soon after that telemedicine visit, it seemed to make her constipated, so she stopped for a while.  She then resumed it and seems to become accustomed to it.  She takes 2 tablets every morning, and usually has a formed BM in the evening.  Sometimes it is for more small pellets, but usually normal. She is also careful not to take any medicines within a few hours of it.  She takes her oral contraceptive about 10 PM (bedtime).  Lauren Murphy is also on Trulicity recently, but has not been doing it consistently because it causes diarrhea.  She plans to speak with her primary care provider about this soon. Denies rectal bleeding. ROS: Cardiovascular:  no chest pain Respiratory: no dyspnea  The patient's Past Medical, Family and Social History were reviewed and are on file in the EMR.  Objective:  Med list reviewed  Current Outpatient Medications:  .  colesevelam (WELCHOL) 625 MG tablet, Take 2 tablets (1,250 mg total) by mouth daily with breakfast., Disp: 60 tablet, Rfl: 11 .  norgestrel-ethinyl estradiol (CRYSELLE-28) 0.3-30 MG-MCG tablet, Take 1 tablet by mouth daily., Disp: , Rfl:  .  TRULICITY 0.75 MG/0.5ML SOPN, INJECT 0.75 MG INTO THE SKIN ONCE A WEEK., Disp: 2 pen, Rfl: 2   Vital signs in last 24 hrs: Vitals:   12/02/19 1554  BP: 126/70  Pulse: 98  Temp: (!) 97.5 F (36.4 C)    Physical Exam  Well-appearing  HEENT: sclera anicteric, oral mucosa moist without lesions  Neck: supple, no thyromegaly, JVD or lymphadenopathy  Cardiac: RRR without murmurs, S1S2 heard, no peripheral edema  Pulm: clear to auscultation bilaterally, normal RR  and effort noted  Abdomen: soft, no tenderness, with active bowel sounds. No guarding or palpable hepatosplenomegaly.  Skin; warm and dry, no jaundice or rash  Labs:   ___________________________________________ Radiologic studies:   ____________________________________________ Other:   _____________________________________________ Assessment & Plan  Assessment: Encounter Diagnosis  Name Primary?  . Chronic diarrhea Yes   Bile acid diarrhea.  Doing well on low-dose WelChol.   Plan: She will decrease to 1 tablet in the morning and see if there is still good control with that. Medicine refilled, see me in a year or sooner as needed.  20 minutes were spent on this encounter (including chart review, history/exam, counseling/coordination of care, and documentation)  Charlie Pitter III

## 2020-01-08 ENCOUNTER — Other Ambulatory Visit: Payer: Self-pay | Admitting: Physician Assistant

## 2020-02-08 DIAGNOSIS — H608X3 Other otitis externa, bilateral: Secondary | ICD-10-CM | POA: Diagnosis not present

## 2020-02-08 DIAGNOSIS — H6123 Impacted cerumen, bilateral: Secondary | ICD-10-CM | POA: Diagnosis not present

## 2020-04-10 ENCOUNTER — Encounter: Payer: Self-pay | Admitting: Physician Assistant

## 2020-04-17 DIAGNOSIS — H608X3 Other otitis externa, bilateral: Secondary | ICD-10-CM | POA: Diagnosis not present

## 2020-04-17 DIAGNOSIS — H6121 Impacted cerumen, right ear: Secondary | ICD-10-CM | POA: Diagnosis not present

## 2020-06-28 DIAGNOSIS — H608X3 Other otitis externa, bilateral: Secondary | ICD-10-CM | POA: Diagnosis not present

## 2020-07-05 DIAGNOSIS — L02416 Cutaneous abscess of left lower limb: Secondary | ICD-10-CM | POA: Diagnosis not present

## 2020-07-20 IMAGING — DX DG CHEST 1V PORT
1 series · 1 of 1 positions shown · non-contrast
Comparison: None.

CLINICAL DATA: Shortness of breath, COVID positive

EXAM:
PORTABLE CHEST 1 VIEW

[chest ap]
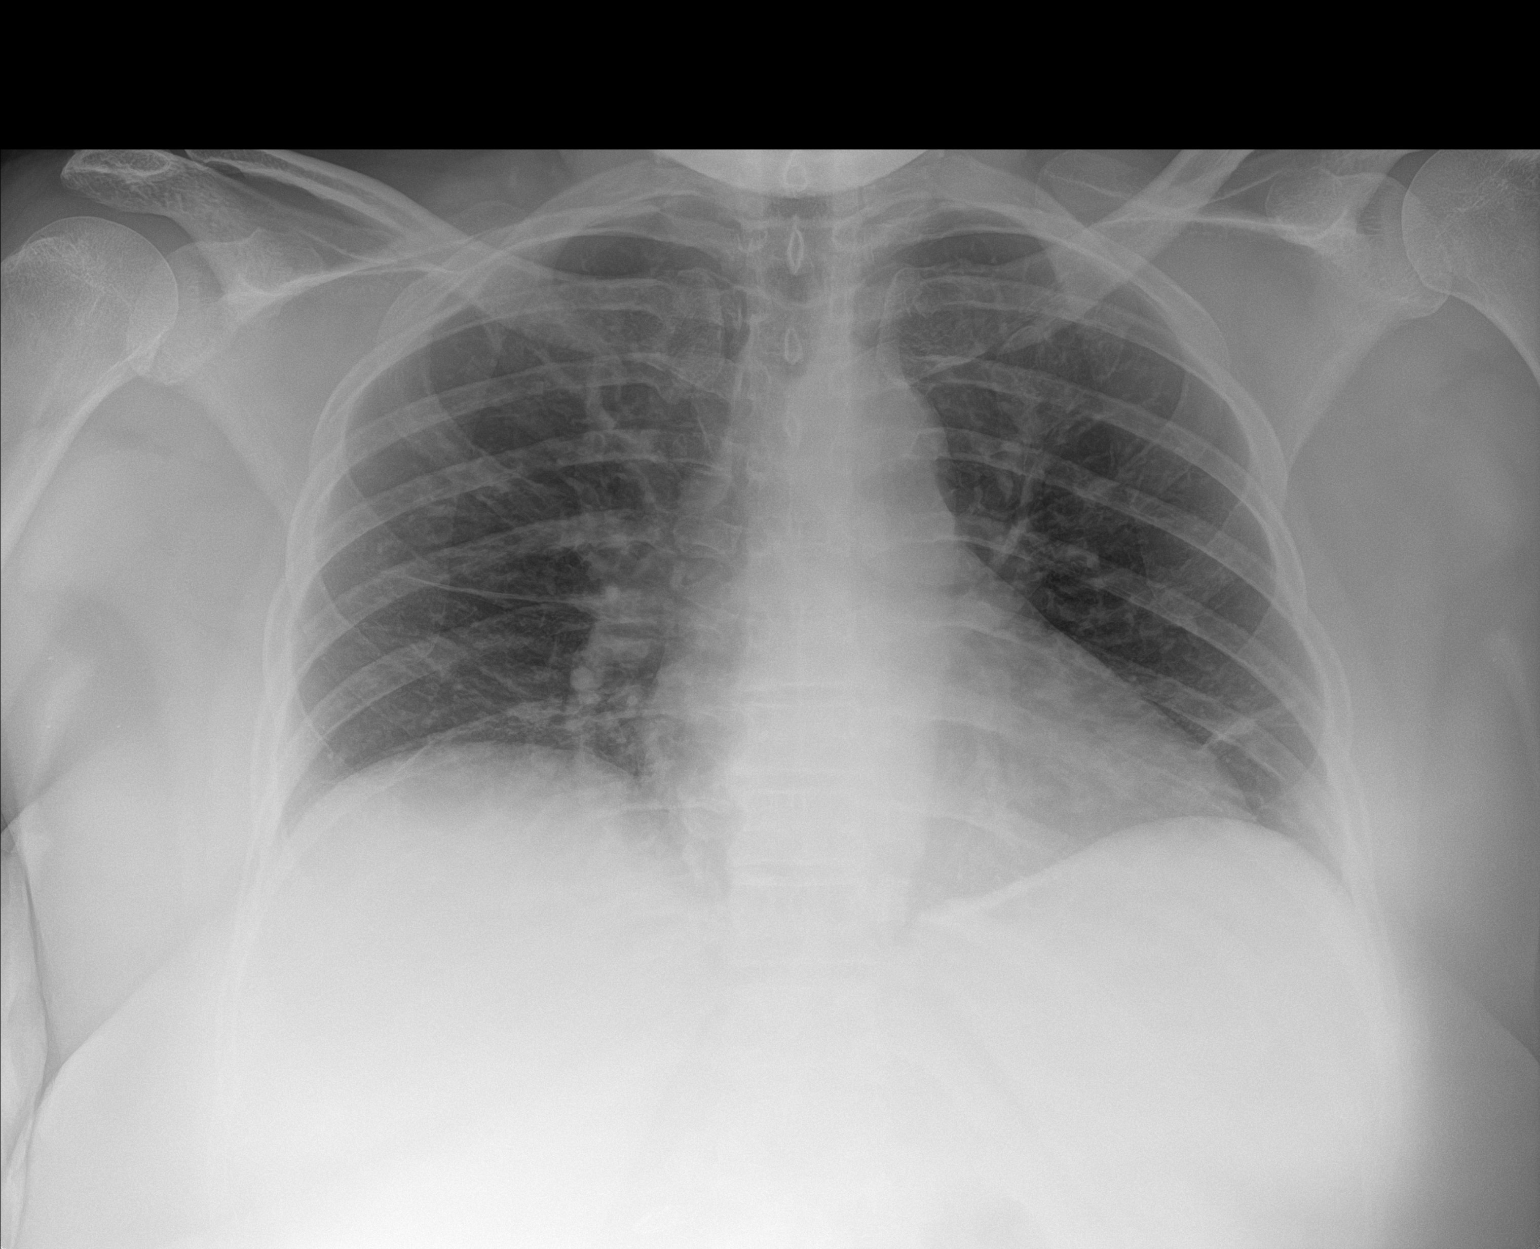

[1 of 1 positions shown; findings below may reference images not displayed]

FINDINGS: Streaky bibasilar atelectasis. No consolidation or effusion. Normal
heart size. No pneumothorax.
IMPRESSION: Streaky basilar atelectasis.

## 2020-07-24 ENCOUNTER — Telehealth: Payer: Self-pay

## 2020-07-24 NOTE — Telephone Encounter (Signed)
Pt is seeking guidance with a billing issue. She called our billing department about a hospital bill. She had her account on [employer], but her bank account got hacked so she had to change her bank cards. Therefore she missed 2 months of payments. She called the billing department today to rectify this issue. Pt was wanting to be the $72 she missed, then pick up on her December payment. The billing employee told her that she would need to pay 72 today for past bills and 72 today for November to create a new payment plan, as they do not restart payment plans. Pt says she is unable to pay the $144 right now. Pt asked to speak to a manager to figure this out, as she does not want her bill to go to collections. Employee told pt that there is no supervisor for pt to talk to and stated " this is not a supervisor issue." I told pt I would relay this message to my supervisor, as she may be able to point her in a better direction than I.

## 2020-07-25 ENCOUNTER — Ambulatory Visit: Payer: Self-pay | Admitting: Obstetrics and Gynecology

## 2020-08-01 NOTE — Telephone Encounter (Signed)
I have sent an email to the billing department to reach out to patient.   I have also left patient a VM advising if she does not hear something within the next week to give me a call back.

## 2020-08-20 DIAGNOSIS — Z01419 Encounter for gynecological examination (general) (routine) without abnormal findings: Secondary | ICD-10-CM | POA: Diagnosis not present

## 2020-08-20 DIAGNOSIS — Z309 Encounter for contraceptive management, unspecified: Secondary | ICD-10-CM | POA: Diagnosis not present

## 2020-08-26 ENCOUNTER — Other Ambulatory Visit: Payer: Self-pay | Admitting: Physician Assistant

## 2020-11-21 ENCOUNTER — Telehealth: Payer: Self-pay | Admitting: *Deleted

## 2020-11-21 ENCOUNTER — Other Ambulatory Visit: Payer: Self-pay | Admitting: Physician Assistant

## 2020-11-21 NOTE — Telephone Encounter (Signed)
Called [facility] spoke to Summer and cancelled Rx for Trazodone. Told her to tell pt she needs an appt. Summer verbalized understanding and will cancel Rx.

## 2021-02-17 ENCOUNTER — Other Ambulatory Visit: Payer: Self-pay | Admitting: Gastroenterology

## 2021-03-01 ENCOUNTER — Other Ambulatory Visit: Payer: Self-pay

## 2021-03-01 ENCOUNTER — Encounter (HOSPITAL_BASED_OUTPATIENT_CLINIC_OR_DEPARTMENT_OTHER): Payer: Self-pay

## 2021-03-01 ENCOUNTER — Emergency Department (HOSPITAL_BASED_OUTPATIENT_CLINIC_OR_DEPARTMENT_OTHER): Payer: 59 | Admitting: Radiology

## 2021-03-01 ENCOUNTER — Emergency Department (HOSPITAL_BASED_OUTPATIENT_CLINIC_OR_DEPARTMENT_OTHER)
Admission: EM | Admit: 2021-03-01 | Discharge: 2021-03-01 | Disposition: A | Discharge status: Home / Self Care | Payer: 59 | Attending: Emergency Medicine | Admitting: Emergency Medicine

## 2021-03-01 ENCOUNTER — Emergency Department (HOSPITAL_BASED_OUTPATIENT_CLINIC_OR_DEPARTMENT_OTHER): Payer: 59

## 2021-03-01 DIAGNOSIS — R0789 Other chest pain: Secondary | ICD-10-CM | POA: Diagnosis present

## 2021-03-01 DIAGNOSIS — R61 Generalized hyperhidrosis: Secondary | ICD-10-CM | POA: Insufficient documentation

## 2021-03-01 DIAGNOSIS — R079 Chest pain, unspecified: Secondary | ICD-10-CM

## 2021-03-01 DIAGNOSIS — E785 Hyperlipidemia, unspecified: Secondary | ICD-10-CM | POA: Insufficient documentation

## 2021-03-01 DIAGNOSIS — Z8616 Personal history of COVID-19: Secondary | ICD-10-CM | POA: Diagnosis not present

## 2021-03-01 DIAGNOSIS — E1169 Type 2 diabetes mellitus with other specified complication: Secondary | ICD-10-CM | POA: Insufficient documentation

## 2021-03-01 DIAGNOSIS — Z7984 Long term (current) use of oral hypoglycemic drugs: Secondary | ICD-10-CM | POA: Diagnosis not present

## 2021-03-01 LAB — TROPONIN I (HIGH SENSITIVITY)
Troponin I (High Sensitivity): <2 ng/L (ref <18)
Troponin I (High Sensitivity): <2 ng/L (ref <18)

## 2021-03-01 LAB — BASIC METABOLIC PANEL
Anion gap: 12 (ref 5–15)
BUN: 8 mg/dL (ref 6–20)
CO2: 25 mmol/L (ref 22–32)
Calcium: 9 mg/dL (ref 8.9–10.3)
Chloride: 102 mmol/L (ref 98–111)
Creatinine, Ser: 0.77 mg/dL (ref 0.44–1.00)
GFR, Estimated: >60 mL/min (ref >60)
Glucose, Bld: 158 mg/dL — ABNORMAL HIGH (ref 70–99)
Potassium: 3.7 mmol/L (ref 3.5–5.1)
Sodium: 139 mmol/L (ref 135–145)

## 2021-03-01 LAB — CBC
HCT: 39.6 % (ref 36.0–46.0)
Hemoglobin: 12.8 g/dL (ref 12.0–15.0)
MCH: 29.6 pg (ref 26.0–34.0)
MCHC: 32.3 g/dL (ref 30.0–36.0)
MCV: 91.5 fL (ref 80.0–100.0)
Platelets: 278 10*3/uL (ref 150–400)
RBC: 4.33 MIL/uL (ref 3.87–5.11)
RDW: 12.5 % (ref 11.5–15.5)
WBC: 8 10*3/uL (ref 4.0–10.5)
nRBC: 0 % (ref 0.0–0.2)

## 2021-03-01 LAB — D-DIMER, QUANTITATIVE: D-Dimer, Quant: 0.63 ug/mL-FEU — ABNORMAL HIGH (ref 0.00–0.50)

## 2021-03-01 LAB — PREGNANCY, URINE: Preg Test, Ur: NEGATIVE

## 2021-03-01 MED ORDER — IOHEXOL 350 MG/ML SOLN
75.0000 mL | Freq: Once | INTRAVENOUS | Status: AC | PRN
  Administered 2021-03-01: 75 mL via INTRAVENOUS

## 2021-03-01 NOTE — ED Triage Notes (Signed)
Pt arrives from urgent care with 1 day history of upper left chest pain.  Denies shortness of breath, nausea, vomiting. Denies fever or cough. States skin feels "clammy".    States she took a baby aspirin around 7:00 am.

## 2021-03-01 NOTE — ED Provider Notes (Signed)
[facility] EMERGENCY DEPT Provider Note   CSN: [id number] Arrival date & time: 03/01/21  0854     History Chief Complaint  Patient presents with   Chest Pain    Lauren Murphy is a 40 y.o. female.  Lauren Murphy started experiencing a sharp, left-sided chest pain yesterday.  It awoke her from her sleep around 6 AM, and it persisted throughout the morning.  She was evaluated at her primary care practice and sent to the emergency department.  She has never experienced anything like this before.  She does take oral contraceptives.   Chest Pain Associated symptoms: no abdominal pain, no back pain, no cough, no fever, no palpitations, no shortness of breath and no vomiting    HPI: A 40 year old patient with a history of obesity presents for evaluation of chest pain. Initial onset of pain was less than one hour ago. The patient's chest pain is sharp and is not worse with exertion. The patient reports some diaphoresis. The patient's chest pain is middle- or left-sided, is not well-localized, is not described as heaviness/pressure/tightness and does not radiate to the arms/jaw/neck. The patient does not complain of nausea. The patient has no history of stroke, has no history of peripheral artery disease, has not smoked in the past 90 days, denies any history of treated diabetes, has no relevant family history of coronary artery disease (first degree relative at less than age 75), is not hypertensive and has no history of hypercholesterolemia.   Past Medical History:  Diagnosis Date   Cervical high risk HPV (human papillomavirus) test positive 2014, 2015   Colposcopy 2016 - negative.   CIN I (cervical intraepithelial neoplasia I) 2006, 2008, 2010   colposcopic biopsies - [place]   COVID-19    Diabetes mellitus without complication (HCC)    Febrile seizures (HCC)    as a child--pt.states epileptic seizures   HSV-1 (herpes simplex virus 1) infection     Patient Active Problem  List   Diagnosis Date Noted   Pneumonia due to COVID-19 virus 08/11/2019   Hypoxia 08/11/2019   Uncontrolled type 2 diabetes mellitus with hyperglycemia (HCC) 08/11/2019   Cardiomegaly 08/11/2019   Hyperlipidemia 08/11/2019   Transaminitis 08/11/2019   Diabetes mellitus without complication (HCC) 02/09/2019   Chronic pain of right knee 12/18/2018    Past Surgical History:  Procedure Laterality Date   CHOLECYSTECTOMY  2003     OB History     Gravida  2   Para  2   Term  2   Preterm      AB      Living  2      SAB      IAB      Ectopic      Multiple      Live Births              Family History  Problem Relation Age of Onset   Diabetes Mother    Thyroid disease Mother        hypothyroid   Hypertension Father    Diabetes Sister    Diabetes Maternal Grandmother    Stroke Maternal Grandmother    Hypertension Paternal Grandmother    Stroke Paternal Grandmother    Diabetes Paternal Grandfather    Glaucoma Paternal Grandfather    Diabetes Sister    Colon cancer Neg Hx    Stomach cancer Neg Hx    Pancreatic cancer Neg Hx    Pancreatic disease Neg Hx  Social History   Tobacco Use   Smoking status: Never   Smokeless tobacco: Never  Vaping Use   Vaping Use: Never used  Substance Use Topics   Alcohol use: Yes    Comment: rarely   Drug use: Never    Home Medications Prior to Admission medications   Medication Sig Start Date End Date Taking? Authorizing Provider  colesevelam (WELCHOL) 625 MG tablet TAKE 2 TABLETS (1,250 MG TOTAL) BY MOUTH DAILY WITH BREAKFAST. 02/18/21   Sherrilyn Rist, MD  norgestrel-ethinyl estradiol (CRYSELLE-28) 0.3-30 MG-MCG tablet Take 1 tablet by mouth daily. 02/08/18   [provider]  traZODone (DESYREL) 50 MG tablet TAKE 1/2 -1 TABLETS (25-50 MG TOTAL) BY MOUTH AT BEDTIME AS NEEDED FOR SLEEP. 08/27/20   Jarold Motto, PA  TRULICITY 0.75 MG/0.5ML SOPN INJECT 0.75 MG INTO THE SKIN ONCE A WEEK. 01/09/20    Jarold Motto, PA    Allergies    Patient has no known allergies.  Review of Systems   Review of Systems  Constitutional:  Negative for chills and fever.  HENT:  Negative for ear pain and sore throat.   Eyes:  Negative for pain and visual disturbance.  Respiratory:  Negative for cough and shortness of breath.   Cardiovascular:  Positive for chest pain. Negative for palpitations.  Gastrointestinal:  Negative for abdominal pain and vomiting.  Genitourinary:  Negative for dysuria and hematuria.  Musculoskeletal:  Negative for arthralgias and back pain.  Skin:  Negative for color change and rash.  Neurological:  Negative for seizures and syncope.  All other systems reviewed and are negative.  Physical Exam Updated Vital Signs BP (!) 141/93 (BP Location: Right Arm)   Pulse 79   Temp 98.7 F (37.1 C) (Oral)   Resp (!) 21   Ht 5\' 7"  (1.702 m)   Wt 124.7 kg   LMP 01/29/2021   SpO2 100%   BMI 43.07 kg/m   Physical Exam Vitals and nursing note reviewed.  Constitutional:      Appearance: She is well-developed.  HENT:     Head: Normocephalic and atraumatic.  Cardiovascular:     Rate and Rhythm: Normal rate and regular rhythm.     Heart sounds: Normal heart sounds.  Pulmonary:     Effort: Pulmonary effort is normal. No tachypnea.     Breath sounds: Normal breath sounds.  Abdominal:     Palpations: Abdomen is soft.     Tenderness: There is no abdominal tenderness.  Musculoskeletal:     Right lower leg: No edema.     Left lower leg: No edema.  Skin:    General: Skin is warm and dry.  Neurological:     General: No focal deficit present.     Mental Status: She is alert and oriented to person, place, and time.  Psychiatric:        Mood and Affect: Mood normal.        Behavior: Behavior normal.    ED Results / Procedures / Treatments   Labs (all labs ordered are listed, but only abnormal results are displayed) Labs Reviewed  BASIC METABOLIC PANEL - Abnormal; Notable  for the following components:      Result Value   Glucose, Bld 158 (*)    All other components within normal limits  D-DIMER, QUANTITATIVE - Abnormal; Notable for the following components:   D-Dimer, Quant 0.63 (*)    All other components within normal limits  CBC  PREGNANCY, URINE  TROPONIN  I (HIGH SENSITIVITY)  TROPONIN I (HIGH SENSITIVITY)    EKG EKG Interpretation  Date/Time:  Friday March 01 2021 09:03:15 EDT Ventricular Rate:  83 PR Interval:  156 QRS Duration: 103 QT Interval:  369 QTC Calculation: 434 R Axis:   63 Text Interpretation: Sinus rhythm Inferior infarct, age indeterminate Baseline wander in lead(s) III aVF V6 T wave inversions III, aVF similar to prior Confirmed by Pieter Partridge ([id number]) on 03/01/2021 9:10:10 AM  Radiology DG Chest 2 View  Result Date: 03/01/2021 CLINICAL DATA:  Chest pain. EXAM: CHEST - 2 VIEW COMPARISON:  09/06/2019. FINDINGS: The heart size and mediastinal contours are within normal limits. Both lungs are clear. No pleural effusion or pneumothorax. The visualized skeletal structures are unremarkable. IMPRESSION: No active cardiopulmonary disease. Electronically Signed   By: Amie Portland M.D.   On: 03/01/2021 10:16   CT Angio Chest PE W and/or Wo Contrast  Result Date: 03/01/2021 CLINICAL DATA:  Upper left chest pain. EXAM: CT ANGIOGRAPHY CHEST WITH CONTRAST TECHNIQUE: Multidetector CT imaging of the chest was performed using the standard protocol during bolus administration of intravenous contrast. Multiplanar CT image reconstructions and MIPs were obtained to evaluate the vascular anatomy. CONTRAST:  74mL OMNIPAQUE IOHEXOL 350 MG/ML SOLN COMPARISON:  08/11/2019. FINDINGS: Cardiovascular: The heart size is normal. No substantial pericardial effusion. No thoracic aortic aneurysm. No large central pulmonary embolus in the pulmonary outflow tract or either main pulmonary artery. No evidence for lobar pulmonary embolus. Suboptimal opacification of  segmental and subsegmental pulmonary arteries limits assessment and while no definite filling defect is evident, assessment is considered unreliable. Mediastinum/Nodes: No mediastinal lymphadenopathy. There is no hilar lymphadenopathy. The esophagus has normal imaging features. There is no axillary lymphadenopathy. Lungs/Pleura: No suspicious pulmonary nodule or mass. No focal airspace consolidation. No pleural effusion. Upper Abdomen: The liver shows diffusely decreased attenuation suggesting fat deposition. Musculoskeletal: No worrisome lytic or sclerotic osseous abnormality. Review of the MIP images confirms the above findings. IMPRESSION: 1. No CT evidence for large central pulmonary embolus. Suboptimal opacification of segmental and subsegmental pulmonary arteries limits assessment and while no definite filling defect is evident, assessment is may not be reliable. 2. Otherwise unremarkable exam. Electronically Signed   By: Kennith Center M.D.   On: 03/01/2021 11:01    Procedures Procedures   Medications Ordered in ED Medications  iohexol (OMNIPAQUE) 350 MG/ML injection 75 mL (75 mLs Intravenous Contrast Given 03/01/21 1010)    ED Course  I have reviewed the triage vital signs and the nursing notes.  Pertinent labs & imaging results that were available during my care of the patient were reviewed by me and considered in my medical decision making (see chart for details).    MDM Rules/Calculators/A&P HEAR Score: 2                        Lauren Murphy presents with left-sided chest pain.  She was evaluated for acute coronary syndrome, PE, pneumonia, and other emergent etiology of her symptoms.  ED work-up was largely within normal limits.  She can be discharged home with close PCP follow-up.  Likely benign etiology of symptoms including GI, musculoskeletal, etc. Final Clinical Impression(s) / ED Diagnoses Final diagnoses:  Chest pain, unspecified type    Rx / DC Orders ED Discharge Orders      None        Koleen Distance, MD 03/01/21 1220

## 2021-07-03 ENCOUNTER — Other Ambulatory Visit: Payer: Self-pay

## 2021-07-03 ENCOUNTER — Other Ambulatory Visit: Payer: Self-pay | Admitting: Obstetrics and Gynecology

## 2021-07-03 ENCOUNTER — Ambulatory Visit
Admission: RE | Admit: 2021-07-03 | Discharge: 2021-07-03 | Disposition: A | Discharge status: Home / Self Care | Payer: Medicaid Other | Source: Ambulatory Visit | Attending: Obstetrics and Gynecology | Admitting: Obstetrics and Gynecology

## 2021-07-03 DIAGNOSIS — Z1231 Encounter for screening mammogram for malignant neoplasm of breast: Secondary | ICD-10-CM

## 2021-07-05 ENCOUNTER — Other Ambulatory Visit: Payer: Self-pay | Admitting: Obstetrics and Gynecology

## 2021-07-05 DIAGNOSIS — N6489 Other specified disorders of breast: Secondary | ICD-10-CM

## 2021-07-05 DIAGNOSIS — R928 Other abnormal and inconclusive findings on diagnostic imaging of breast: Secondary | ICD-10-CM

## 2021-07-08 ENCOUNTER — Ambulatory Visit: Payer: 59

## 2021-07-08 ENCOUNTER — Other Ambulatory Visit: Payer: Self-pay

## 2021-07-08 ENCOUNTER — Ambulatory Visit
Admission: RE | Admit: 2021-07-08 | Discharge: 2021-07-08 | Disposition: A | Discharge status: Home / Self Care | Payer: Medicaid Other | Source: Ambulatory Visit | Attending: Obstetrics and Gynecology | Admitting: Obstetrics and Gynecology

## 2021-07-08 DIAGNOSIS — R928 Other abnormal and inconclusive findings on diagnostic imaging of breast: Secondary | ICD-10-CM

## 2021-10-27 ENCOUNTER — Encounter (HOSPITAL_BASED_OUTPATIENT_CLINIC_OR_DEPARTMENT_OTHER): Payer: Self-pay | Admitting: Emergency Medicine

## 2021-10-27 ENCOUNTER — Other Ambulatory Visit: Payer: Self-pay

## 2021-10-27 ENCOUNTER — Ambulatory Visit (HOSPITAL_COMMUNITY)
Admission: EM | Admit: 2021-10-27 | Discharge: 2021-10-27 | Disposition: A | Discharge status: Home / Self Care | Payer: Medicaid Other | Attending: Urgent Care | Admitting: Urgent Care

## 2021-10-27 ENCOUNTER — Emergency Department (HOSPITAL_BASED_OUTPATIENT_CLINIC_OR_DEPARTMENT_OTHER)
Admission: EM | Admit: 2021-10-27 | Discharge: 2021-10-28 | Disposition: A | Discharge status: Home / Self Care | Payer: Medicaid Other | Attending: Emergency Medicine | Admitting: Emergency Medicine

## 2021-10-27 ENCOUNTER — Encounter (HOSPITAL_COMMUNITY): Payer: Self-pay

## 2021-10-27 DIAGNOSIS — Z8616 Personal history of COVID-19: Secondary | ICD-10-CM | POA: Diagnosis not present

## 2021-10-27 DIAGNOSIS — Z794 Long term (current) use of insulin: Secondary | ICD-10-CM | POA: Diagnosis not present

## 2021-10-27 DIAGNOSIS — E119 Type 2 diabetes mellitus without complications: Secondary | ICD-10-CM | POA: Insufficient documentation

## 2021-10-27 DIAGNOSIS — H6121 Impacted cerumen, right ear: Secondary | ICD-10-CM | POA: Insufficient documentation

## 2021-10-27 DIAGNOSIS — H61309 Acquired stenosis of external ear canal, unspecified, unspecified ear: Secondary | ICD-10-CM

## 2021-10-27 DIAGNOSIS — H6123 Impacted cerumen, bilateral: Secondary | ICD-10-CM | POA: Diagnosis not present

## 2021-10-27 DIAGNOSIS — R42 Dizziness and giddiness: Secondary | ICD-10-CM | POA: Diagnosis present

## 2021-10-27 NOTE — ED Triage Notes (Signed)
Pt presents with bilateral ear fullness that is causing dizziness and nausea X 2 days.

## 2021-10-27 NOTE — ED Triage Notes (Signed)
Pt reports of right ear fullness x 2 days with nausea, reports was seen by [facility] earlier today, they attempted to irrigate her ear and now she feels as if there is a pocket of fluid in her ear

## 2021-10-27 NOTE — Discharge Instructions (Addendum)
Both ears were obstructed with earwax. All of the wax on the left was removed. There is still about 10% of hard wax obstructing your eardrum on the right, which would be best treated at home with Debrox earwax softener. Please purchase the [employer] clear canal kit and perform the lavage at home. If it still is not removed, please follow-up with ENT.

## 2021-10-27 NOTE — ED Provider Notes (Signed)
[place]    CSN: [id number] Arrival date & time: 10/27/21  1651      History   Chief Complaint Chief Complaint  Patient presents with   Ear Fullness    HPI Lauren Murphy is a 41 y.o. female.   41 year old female presents today with worsening pressure in her ears.  She states she has a known history of eczema of the ear canal which causes "recurrent buildup".  She is to follow-up with an ENT however lost her insurance and thus has not seen them.  She reports usually having maintenance earwax lavage as but has not had 1 in a very long time.  Due to this she is now feeling pressure in her ear.  She denies any pain but states that her ears are very full.  The symptoms are primarily on the right side today.  She reports the buildup and pressure is causing her to feel dizzy and nauseated.  She states this is extremely typical for her and occurs every time she gets these earwax buildup's.  She denies any other symptoms causing her dizziness or nausea.  She denies any additional URI symptoms.   Ear Fullness   Past Medical History:  Diagnosis Date   Cervical high risk HPV (human papillomavirus) test positive 2014, 2015   Colposcopy 2016 - negative.   CIN I (cervical intraepithelial neoplasia I) 2006, 2008, 2010   [place]poscopic biopsies - [place]   COVID-19    Diabetes mellitus without complication ([place])    Febrile seizures ([place])    as a child--pt.states epileptic seizures   HSV-1 (herpes simplex virus 1) infection     Patient Active Problem List   Diagnosis Date Noted   Pneumonia due to COVID-19 virus 08/11/2019   Hypoxia 08/11/2019   Uncontrolled type 2 diabetes mellitus with hyperglycemia ([place]) 08/11/2019   Cardiomegaly 08/11/2019   Hyperlipidemia 08/11/2019   Transaminitis 08/11/2019   Diabetes mellitus without complication ([place]) 00/76/2263   Chronic pain of right knee 12/18/2018    Past Surgical History:  Procedure Laterality Date   CHOLECYSTECTOMY  2003     OB History     Gravida  2   Para  2   Term  2   Preterm      AB      Living  2      SAB      IAB      Ectopic      Multiple      Live Births               Home Medications    Prior to Admission medications   Medication Sig Start Date End Date Taking? Authorizing Provider  colesevelam (WELCHOL) 625 MG tablet TAKE 2 TABLETS (1,250 MG TOTAL) BY MOUTH DAILY WITH BREAKFAST. 02/18/21   Doran Stabler, MD  norgestrel-ethinyl estradiol (CRYSELLE-28) 0.3-30 MG-MCG tablet Take 1 tablet by mouth daily. 02/08/18   [provider]  traZODone (DESYREL) 50 MG tablet TAKE 1/2 -1 TABLETS (25-50 MG TOTAL) BY MOUTH AT BEDTIME AS NEEDED FOR SLEEP. 08/27/20   Inda Coke, PA  TRULICITY 3.35 KT/6.2BW SOPN INJECT 0.75 MG INTO THE SKIN ONCE A WEEK. 01/09/20   Inda Coke, PA    Family History Family History  Problem Relation Age of Onset   Diabetes Mother    Thyroid disease Mother        hypothyroid   Hypertension Father    Diabetes Sister    Diabetes Maternal  Grandmother    Stroke Maternal Grandmother    Hypertension Paternal Grandmother    Stroke Paternal Grandmother    Diabetes Paternal Grandfather    Glaucoma Paternal Grandfather    Diabetes Sister    Colon cancer Neg Hx    Stomach cancer Neg Hx    Pancreatic cancer Neg Hx    Pancreatic disease Neg Hx     Social History Social History   Tobacco Use   Smoking status: Never   Smokeless tobacco: Never  Vaping Use   Vaping Use: Never used  Substance Use Topics   Alcohol use: Yes    Comment: rarely   Drug use: Never     Allergies   Patient has no known allergies.   Review of Systems Review of Systems  HENT:  Positive for ear pain.   Gastrointestinal:  Positive for nausea.  Neurological:  Positive for dizziness.  All other systems reviewed and are negative.   Physical Exam Triage Vital Signs ED Triage Vitals  Enc Vitals Group     BP 10/27/21 1751 133/70     Pulse Rate  10/27/21 1751 83     Resp --      Temp --      Temp src --      SpO2 10/27/21 1751 100 %     Weight --      Height --      Head Circumference --      Peak Flow --      Pain Score 10/27/21 1752 1     Pain Loc --      Pain Edu? --      Excl. in [place]? --    No data found.  Updated Vital Signs BP 133/70 (BP Location: Right Arm)    Pulse 83    LMP  (LMP Unknown)    SpO2 100%   Visual Acuity Right Eye Distance:   Left Eye Distance:   Bilateral Distance:    Right Eye Near:   Left Eye Near:    Bilateral Near:     Physical Exam Vitals and nursing note reviewed.  Constitutional:      General: She is not in acute distress.    Appearance: Normal appearance. She is well-developed. She is obese. She is not ill-appearing or toxic-appearing.  HENT:     Head: Normocephalic and atraumatic.     Right Ear: There is impacted cerumen.     Left Ear: There is impacted cerumen.     Nose: Nose normal. No congestion or rhinorrhea.     Mouth/Throat:     Mouth: Mucous membranes are moist.     Pharynx: Oropharynx is clear. No oropharyngeal exudate or posterior oropharyngeal erythema.  Eyes:     General: No scleral icterus.       Right eye: No discharge.        Left eye: No discharge.     Extraocular Movements: Extraocular movements intact.     Conjunctiva/sclera: Conjunctivae normal.     Pupils: Pupils are equal, round, and reactive to light.  Cardiovascular:     Rate and Rhythm: Normal rate and regular rhythm.     Heart sounds: No murmur heard. Pulmonary:     Effort: Pulmonary effort is normal. No respiratory distress.     Breath sounds: Normal breath sounds.  Abdominal:     Palpations: Abdomen is soft.     Tenderness: There is no abdominal tenderness.  Musculoskeletal:  General: No swelling.     Cervical back: Normal range of motion and neck supple. No rigidity or tenderness.  Lymphadenopathy:     Cervical: No cervical adenopathy.  Skin:    General: Skin is warm and dry.      Capillary Refill: Capillary refill takes less than 2 seconds.  Neurological:     General: No focal deficit present.     Mental Status: She is alert and oriented to person, place, and time.     Cranial Nerves: No cranial nerve deficit.     Sensory: No sensory deficit.     Motor: No weakness.     Coordination: Coordination normal.  Psychiatric:        Mood and Affect: Mood normal.     UC Treatments / Results  Labs (all labs ordered are listed, but only abnormal results are displayed) Labs Reviewed - No data to display  EKG   Radiology No results found.  Procedures Ear Cerumen Removal  Date/Time: 10/27/2021 8:12 PM Performed by: Chaney Malling, PA Authorized by: Chaney Malling, PA   Consent:    Consent obtained:  Verbal   Consent given by:  Patient   Risks, benefits, and alternatives were discussed: yes     Risks discussed:  Bleeding, dizziness, incomplete removal, infection, pain and TM perforation   Alternatives discussed:  No treatment and alternative treatment Universal protocol:    Procedure explained and questions answered to patient or proxy's satisfaction: yes   Procedure details:    Location:  L ear and R ear   Procedure type: curette     Procedure type comment:  Both methods utilized, but primarily curette   Procedure outcomes: cerumen removed (100% removal on L, 90% removal on R)   Post-procedure details:    Post-procedure ear inspection: L clear; R had hard wax upon TM.   Procedure completion:  Tolerated (including critical care time)  Medications Ordered in UC Medications - No data to display  Initial Impression / Assessment and Plan / UC Course  I have reviewed the triage vital signs and the nursing notes.  Pertinent labs & imaging results that were available during my care of the patient were reviewed by me and considered in my medical decision making (see chart for details).     B cerumen impaction -left removed fully in office, 90% of the  right was removed in office via curette.  Recommended patient purchase the NeilMed clear canal kit and use Debrox for the next several days prior to flushing at home.  Follow-up with the ENT should symptoms persist Dizziness -improved in office after removal of cerumen.  Patient states this occurs every time she gets this.  No further work-up indicated  Final Clinical Impressions(s) / UC Diagnoses   Final diagnoses:  Bilateral impacted cerumen     Discharge Instructions      Both ears were obstructed with earwax. All of the wax on the left was removed. There is still about 10% of hard wax obstructing your eardrum on the right, which would be best treated at home with Debrox earwax softener. Please purchase the NeilMed clear canal kit and perform the lavage at home. If it still is not removed, please follow-up with ENT.     ED Prescriptions   None    PDMP not reviewed this encounter.   Chaney Malling, [place] 10/27/21 2014

## 2021-10-28 NOTE — Discharge Instructions (Signed)
You were seen today for decreased hearing.  You had a fair bit of dead skin in your external auditory canal which may be related to your eczema.  Follow-up with ENT.

## 2021-10-28 NOTE — ED Provider Notes (Signed)
[facility] EMERGENCY DEPT Provider Note   CSN: [id number] Arrival date & time: 10/27/21  2046     History  Chief Complaint  Patient presents with   Ear Fullness    Genova Marchbank is a 41 y.o. female.  HPI     This is a 41 year old female who presents with right-sided ear fullness.  Patient reports she has had several day history of worsening right-sided ear fullness.  She began to have some nausea and room spinning dizziness.  She was seen and evaluated at urgent care earlier today and had some earwax flush from the ear.  She states she has fluid in her ear and that the fluid from the irrigation is making her symptoms worse.  She has not had any upper respiratory symptoms.  No cough or congestion.  No fevers.  Denies any significant pain  Urgent care note reviewed today.  She did have cerumen impaction removal with some persistent noted obstruction of the external auditory canal.  Home Medications Prior to Admission medications   Medication Sig Start Date End Date Taking? Authorizing Provider  colesevelam (WELCHOL) 625 MG tablet TAKE 2 TABLETS (1,250 MG TOTAL) BY MOUTH DAILY WITH BREAKFAST. 02/18/21   Sherrilyn Rist, MD  norgestrel-ethinyl estradiol (CRYSELLE-28) 0.3-30 MG-MCG tablet Take 1 tablet by mouth daily. 02/08/18   [provider]  traZODone (DESYREL) 50 MG tablet TAKE 1/2 -1 TABLETS (25-50 MG TOTAL) BY MOUTH AT BEDTIME AS NEEDED FOR SLEEP. 08/27/20   Jarold Motto, PA  TRULICITY 0.75 MG/0.5ML SOPN INJECT 0.75 MG INTO THE SKIN ONCE A WEEK. 01/09/20   Jarold Motto, PA      Allergies    Patient has no known allergies.    Review of Systems   Review of Systems  HENT:  Positive for ear pain and hearing loss. Negative for ear discharge.   All other systems reviewed and are negative.  Physical Exam Updated Vital Signs BP (!) 159/95 (BP Location: Right Arm)    Pulse 92    Temp 98.6 F (37 C)    Resp 16    Ht 1.702 m (5\' 7" )    LMP 09/28/2021     SpO2 100%    BMI 43.07 kg/m  Physical Exam Vitals and nursing note reviewed.  Constitutional:      Appearance: She is well-developed. She is obese.  HENT:     Head: Normocephalic and atraumatic.     Ears:     Comments: Complete obstruction of the right TM with what appears to be dead skin and cerumen After removal, TM intact without significant erythema or effusion Eyes:     Pupils: Pupils are equal, round, and reactive to light.  Cardiovascular:     Rate and Rhythm: Normal rate and regular rhythm.  Pulmonary:     Effort: Pulmonary effort is normal. No respiratory distress.  Musculoskeletal:     Cervical back: Neck supple.  Skin:    General: Skin is warm and dry.  Neurological:     Mental Status: She is alert and oriented to person, place, and time.  Psychiatric:        Mood and Affect: Mood normal.    ED Results / Procedures / Treatments   Labs (all labs ordered are listed, but only abnormal results are displayed) Labs Reviewed - No data to display  EKG None  Radiology No results found.  Procedures .Ear Cerumen Removal  Date/Time: 10/28/2021 12:30 AM Performed by: Shon Baton, MD Authorized by:  Samaia Iwata, Mayer Masker, MD   Consent:    Consent obtained:  Verbal   Consent given by:  Patient   Risks, benefits, and alternatives were discussed: yes     Risks discussed:  Bleeding, infection, pain, TM perforation and incomplete removal   Alternatives discussed:  No treatment Procedure details:    Location:  R ear   Procedure type: curette     Procedure outcomes: cerumen removed (Dead skin removed)   Post-procedure details:    Inspection:  Ear canal clear   Hearing quality:  Improved   Procedure completion:  Tolerated    Medications Ordered in ED Medications - No data to display  ED Course/ Medical Decision Making/ A&P                           Medical Decision Making  This patient presents to the ED for concern of decreased hearing, dizziness, this  involves an extensive number of treatment options, and is a complaint that carries with it a high risk of complications and morbidity.  The differential diagnosis includes eustachian tube dysfunction, ear infection, external auditory canal obstruction  MDM:    Patient presents with diminished hearing and feelings of dizziness with persistent "fluid in her ear."  She is nontoxic and vital signs are reassuring.  On exam she has obstruction of the external auditory canal on the right.  I was able to manually remove what appeared to be dead skin which was adhered to the wall of the external auditory canal under direct visualization.  Patient had improvement of her symptoms.  On reevaluation, tympanic membrane is intact without evidence of infection.  She did have some residual debris in the external auditory canal.  She has a history of eczema.  Suspect this may be related.  I have referred her to ENT. (Labs, imaging)  Labs: I Ordered, and personally interpreted labs.  The pertinent results include: None  Imaging Studies ordered: I ordered imaging studies including none I independently visualized and interpreted imaging. I agree with the radiologist interpretation  Additional history obtained from N/A.  External records from outside source obtained and reviewed including recent urgent care visit  Critical Interventions: Cerumen removal  Consultations: I requested consultation with the NA,  and discussed lab and imaging findings as well as pertinent plan - they recommend: N/A  Cardiac Monitoring: The patient was maintained on a cardiac monitor.  I personally viewed and interpreted the cardiac monitored which showed an underlying rhythm of: N/A  Reevaluation: After the interventions noted above, I reevaluated the patient and found that they have :improved   Considered admission for: N/A  Social Determinants of Health: Lives independently  Disposition: Discharge  Co morbidities that  complicate the patient evaluation  Past Medical History:  Diagnosis Date   Cervical high risk HPV (human papillomavirus) test positive 2014, 2015   Colposcopy 2016 - negative.   CIN I (cervical intraepithelial neoplasia I) 2006, 2008, 2010   colposcopic biopsies - [place]   COVID-19    Diabetes mellitus without complication ([facility])    Febrile seizures ([facility])    as a child--pt.states epileptic seizures   HSV-1 (herpes simplex virus 1) infection      Medicines No orders of the defined types were placed in this encounter.   I have reviewed the patients home medicines and have made adjustments as needed  Problem List / ED Course: Problem List Items Addressed This Visit   None  Visit Diagnoses     Obstruction of external auditory canal    -  Primary                   Final Clinical Impression(s) / ED Diagnoses Final diagnoses:  Obstruction of external auditory canal    Rx / DC Orders ED Discharge Orders     None         Lexia Vandevender, Mayer Masker, MD 10/28/21 [phone]

## 2022-04-03 ENCOUNTER — Ambulatory Visit: Payer: Medicaid Other | Admitting: Podiatry

## 2022-06-18 IMAGING — MG DIGITAL DIAGNOSTIC BILAT W/ TOMO W/ CAD
6 of 10 series · 6 of 30 positions shown · non-contrast
Comparison: 07/03/2021.

CLINICAL DATA: Recall from screening for a possible architectural
distortion in the right breast and a possible left breast asymmetry.
Screening exam was the baseline study.

EXAM:
DIGITAL DIAGNOSTIC BILATERAL MAMMOGRAM WITH TOMOSYNTHESIS AND CAD
TECHNIQUE: Bilateral digital diagnostic mammography and breast tomosynthesis
was performed. The images were evaluated with computer-aided
detection.

[R ML synth-2D]
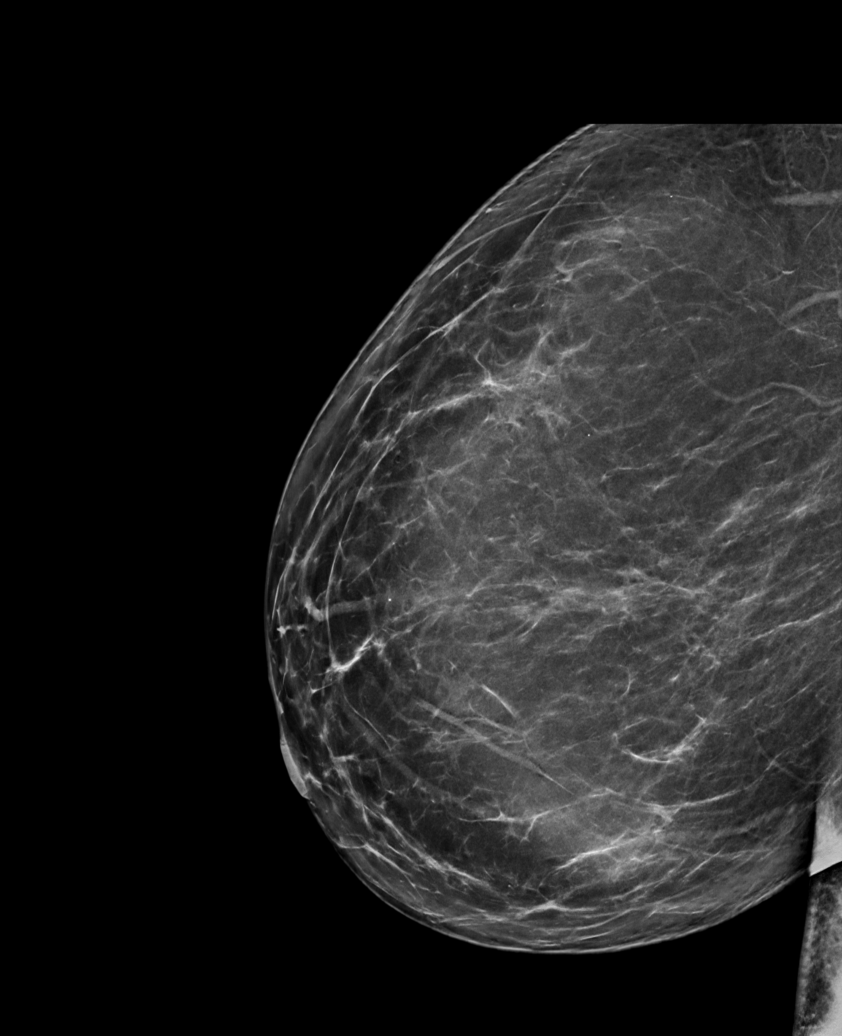

[L ML synth-2D]
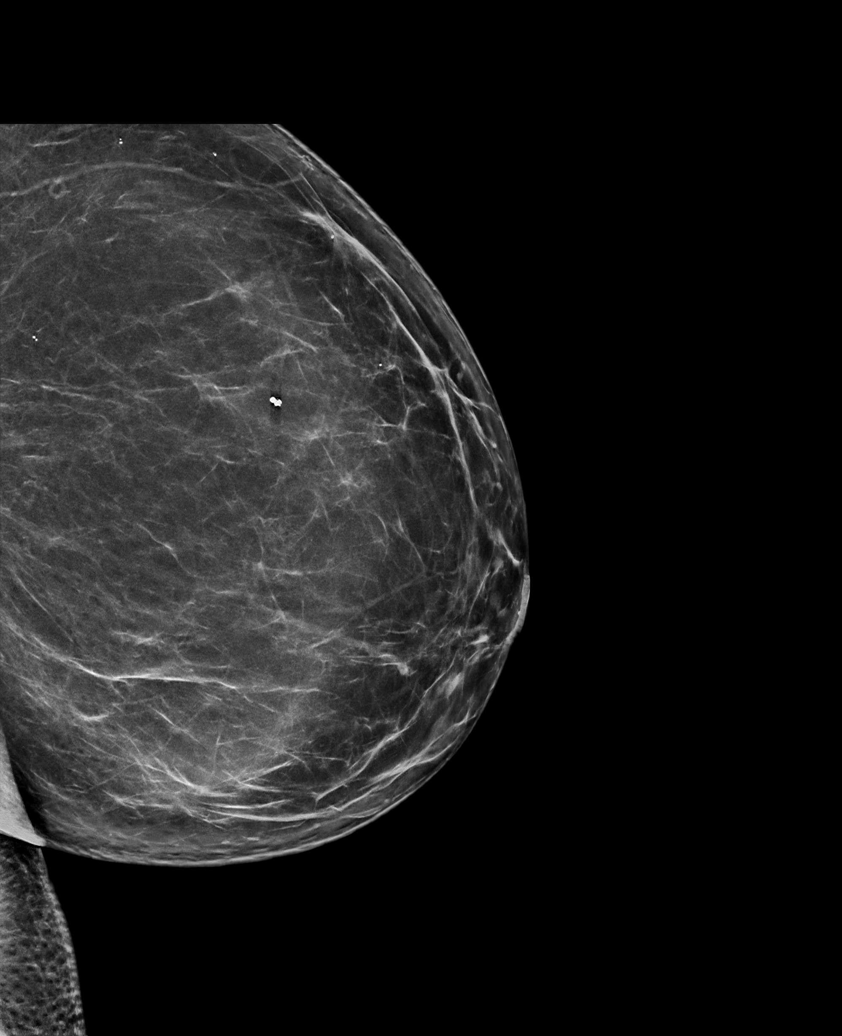

[R MLO synth-2D]
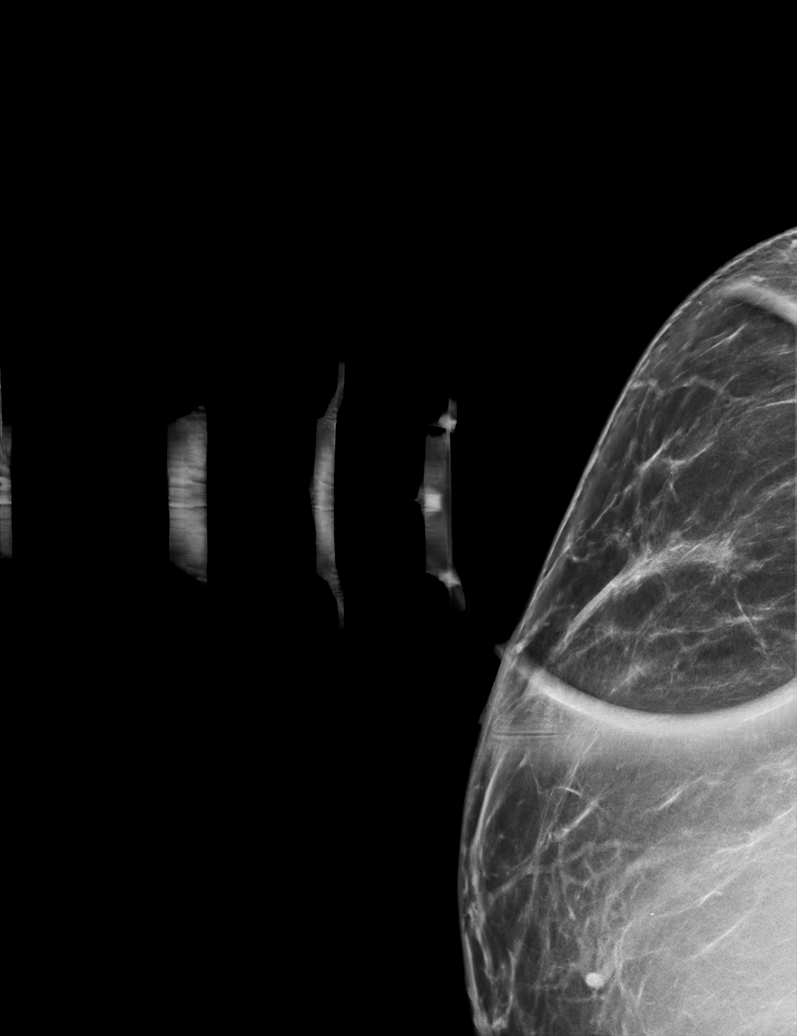

[R CC synth-2D]
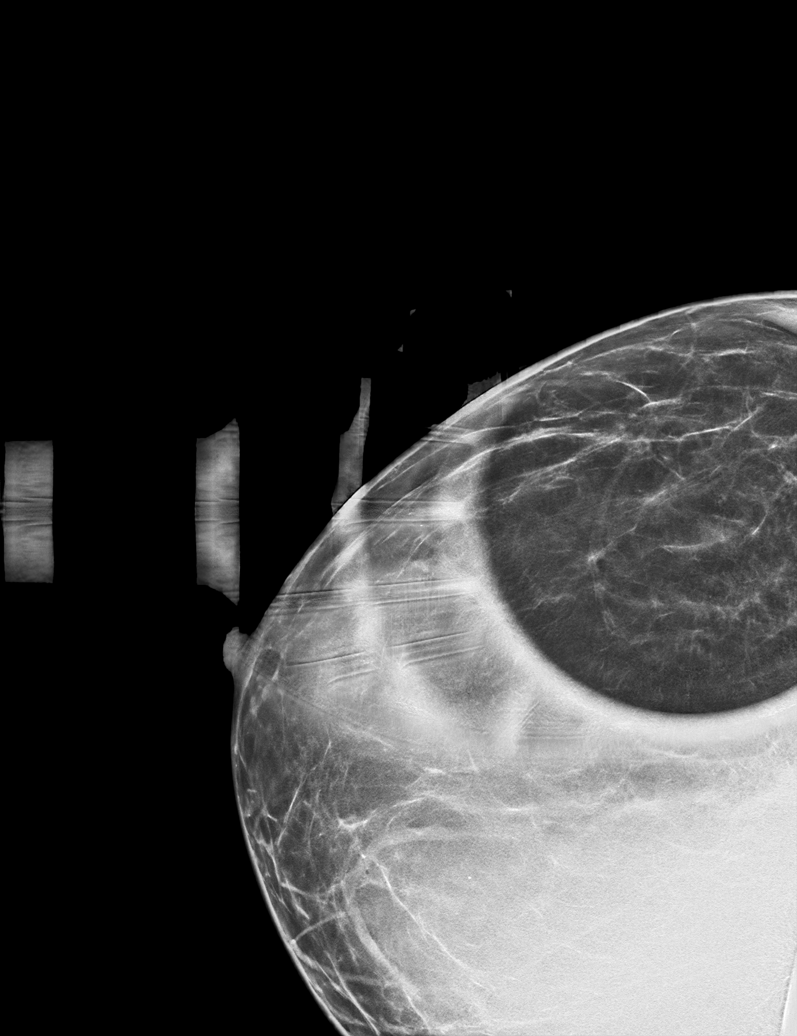

[L MLO synth-2D]
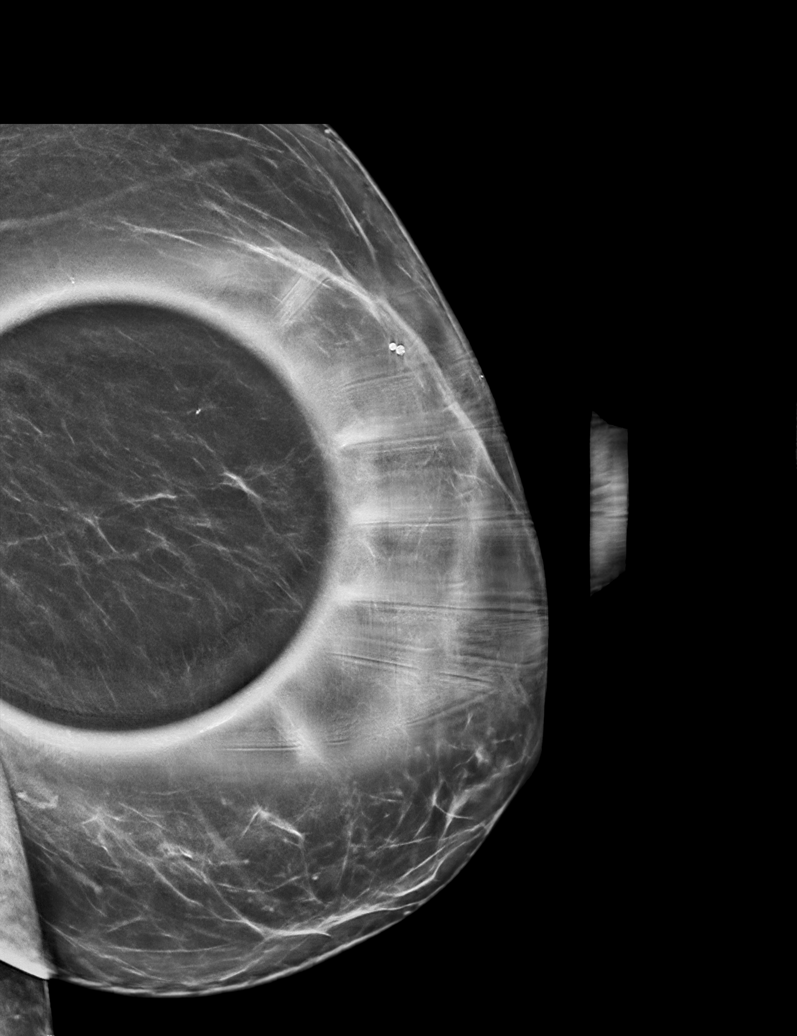

[R CC tomo · tomo slice 32/63.0]
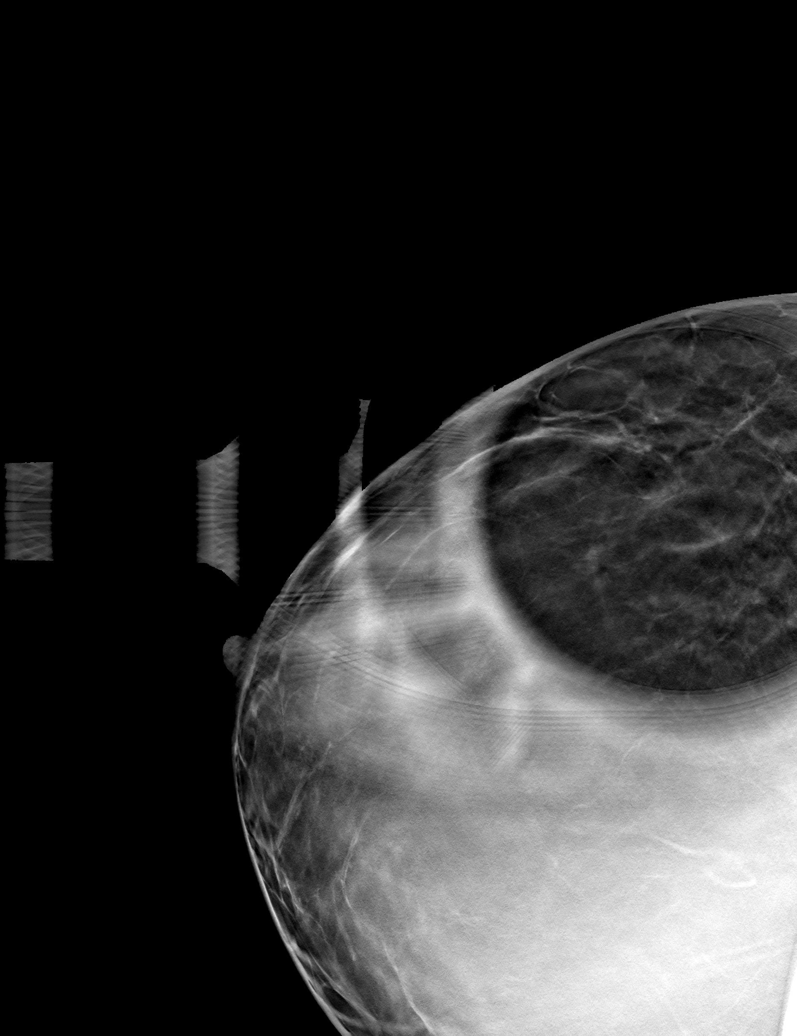

[6 of 30 positions shown; findings below may reference images not displayed]

ACR Breast Density Category b: There are scattered areas of
fibroglandular density.
FINDINGS: The possible architectural distortion in the lateral right breast
disperses on spot compression imaging consistent with superimposed
fibroglandular tissue. There is no true distortion. There is no
evidence of a mass or significant asymmetry and there are no
suspicious calcifications.

On the left, the possible asymmetry disperses on the spot
compression MLO image consistent with superimposed normal
fibroglandular tissue. There is no mass or significant residual
asymmetry. There is no architectural distortion and there are no
suspicious calcifications.
IMPRESSION: No evidence of breast malignancy.

RECOMMENDATION:
Screening mammogram in one year.(Code:HL-U-3DV)

I have discussed the findings and recommendations with the patient.
If applicable, a reminder letter will be sent to the patient
regarding the next appointment.

BI-RADS CATEGORY  1: Negative.

## 2022-08-19 ENCOUNTER — Ambulatory Visit: Payer: Medicaid Other | Attending: Cardiovascular Disease | Admitting: Cardiovascular Disease

## 2022-08-19 ENCOUNTER — Encounter: Payer: Self-pay | Admitting: Cardiovascular Disease

## 2022-08-19 VITALS — BP 124/72 | HR 99 | Ht 67.0 in | Wt 278.0 lb

## 2022-08-19 DIAGNOSIS — R079 Chest pain, unspecified: Secondary | ICD-10-CM | POA: Diagnosis not present

## 2022-08-19 DIAGNOSIS — I2089 Other forms of angina pectoris: Secondary | ICD-10-CM | POA: Diagnosis not present

## 2022-08-19 DIAGNOSIS — E785 Hyperlipidemia, unspecified: Secondary | ICD-10-CM

## 2022-08-19 NOTE — Progress Notes (Signed)
Cardiology Office Note:    Date:  08/19/2022   ID:  Lauren Murphy, DOB Jul 09, 1981, MRN [id number]  PCP:  Jarold Motto, PA   [facility] Providers Cardiologist:  Sherrice Creekmore  Click to update primary MD,subspecialty MD or APP then REFRESH:1}    Referring MD: Jarold Motto, PA   Chief Complaint  Patient presents with   Chest Pain    History of Present Illness:    Lauren Murphy is a 41 y.o. female with a hx of HLD, DM, chest pressure . We were asked to see her today for further evaluation of her chest pressure.  Has an unusual "electrical like " sensation on the left side of her chest  May last 30 seconds Not associated with tachypalpitatio No dizziness, no shortness of breath, no chest pain Typically occurs at rest.  Is not exertional Is not related to eating or drinking.  Is not related to twisting or turning her torso.  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 Will consider in 6 months    Past Medical History:  Diagnosis Date   Cervical high risk HPV (human papillomavirus) test positive 2014, 2015   Colposcopy 2016 - negative.   CIN I (cervical intraepithelial neoplasia I) 2006, 2008, 2010   colposcopic biopsies - [place]   COVID-19    Diabetes mellitus without complication (HCC)    Febrile seizures (HCC)    as a child--pt.states epileptic seizures   HSV-1 (herpes simplex virus 1) infection     Past Surgical History:  Procedure Laterality Date   CHOLECYSTECTOMY  2003    Current Medications: Current Meds  Medication Sig   colesevelam (WELCHOL) 625 MG tablet TAKE 2 TABLETS (1,250 MG TOTAL) BY MOUTH DAILY WITH  BREAKFAST.   norgestrel-ethinyl estradiol (CRYSELLE-28) 0.3-30 MG-MCG tablet Take 1 tablet by mouth daily.   OZEMPIC, 1 MG/DOSE, 4 MG/3ML SOPN Inject 1 mg into the skin once a week.     Allergies:   Patient has no known allergies.   Social History   Socioeconomic History   Marital status: Single    Spouse name: Not on file   Number of children: Not on file   Years of education: Not on file   Highest education level: Not on file  Occupational History   Not on file  Tobacco Use   Smoking status: Never   Smokeless tobacco: Never  Vaping Use   Vaping Use: Never used  Substance and Sexual Activity   Alcohol use: Yes    Comment: rarely   Drug use: Never   Sexual activity: Yes    Birth control/protection: Pill    Comment: Cryselle  Other Topics Concern   Not on file  Social History Narrative   Not on file   Social Determinants of Health   Financial Resource Strain: Not on file  Food Insecurity: Not on file  Transportation Needs: Not on file  Physical Activity: Insufficiently Active (02/07/2019)   Exercise Vital Sign    Days of Exercise per Week: 1 day  Minutes of Exercise per Session: 30 min  Stress: Not on file  Social Connections: Not on file     Family History: The patient's family history includes Diabetes in her maternal grandmother, mother, paternal grandfather, sister, and sister; Glaucoma in her paternal grandfather; Hypertension in her father and paternal grandmother; Stroke in her maternal grandmother and paternal grandmother; Thyroid disease in her mother. There is no history of Colon cancer, Stomach cancer, Pancreatic cancer, or Pancreatic disease.  ROS:   Please see the history of present illness.     All other systems reviewed and are negative.  EKGs/Labs/Other Studies Reviewed:    The following studies were reviewed today:   EKG: August 19, 2022: Normal sinus rhythm at 99.  Nonspecific ST and T wave changes.  Recent Labs: No results found for  requested labs within last 365 days.  Recent Lipid Panel    Component Value Date/Time   CHOL 201 (H) 09/06/2019 1428   TRIG 251.0 (H) 09/06/2019 1428   HDL 52.70 09/06/2019 1428   CHOLHDL 4 09/06/2019 1428   VLDL 50.2 (H) 09/06/2019 1428   LDLDIRECT 99.0 09/06/2019 1428     Risk Assessment/Calculations:               Physical Exam:    VS:  BP 124/72   Pulse 99   Ht 5\' 7"  (1.702 m)   Wt 278 lb (126.1 kg)   SpO2 99%   BMI 43.54 kg/m     Wt Readings from Last 3 Encounters:  08/19/22 278 lb (126.1 kg)  03/01/21 275 lb (124.7 kg)  09/15/19 (!) 304 lb 6.4 oz (138.1 kg)     GEN:  morbidly obese female, very pleasant  in no acute distress HEENT: Normal NECK: No JVD; No carotid bruits LYMPHATICS: No lymphadenopathy CARDIAC: RRR, no murmurs, rubs, gallops RESPIRATORY:  Clear to auscultation without rales, wheezing or rhonchi  ABDOMEN: Soft, non-tender, non-distended MUSCULOSKELETAL:  No edema; No deformity  SKIN: Warm and dry NEUROLOGIC:  Alert and oriented x 3 PSYCHIATRIC:  Normal affect   ASSESSMENT:    1. Hyperlipidemia, unspecified hyperlipidemia type   2. Other forms of angina pectoris   3. Chest pain of uncertain etiology    PLAN:      Noncardiac chest pain: 11/13/19 presents with noncardiac chest pain.  These pains typically occur when she is relaxing.  She does not have any pain at rest.  Will continue to watch these.  We discussed getting a coronary calcium score.  She will consider that in 6 months.  2.  Hyperlipidemia: She has been started on atorvastatin but actually has not received the medication yet.  She has been working on a good diet, exercise, weight loss program.  She is lost 32 pounds over the past several months.  Continue Ozempic.  Continue other medications.           Medication Adjustments/Labs and Tests Ordered: Current medicines are reviewed at length with the patient today.  Concerns regarding medicines are outlined above.  Orders  Placed This Encounter  Procedures   EKG 12-Lead   No orders of the defined types were placed in this encounter.   Patient Instructions  Medication Instructions:  Your physician recommends that you continue on your current medications as directed. Please refer to the Current Medication list given to you today.  *If you need a refill on your cardiac medications before your next appointment, please call your pharmacy*   Lab Work: NONE If you have  labs (blood work) drawn today and your tests are completely normal, you will receive your results only by: MyChart Message (if you have MyChart) OR A paper copy in the mail If you have any lab test that is abnormal or we need to change your treatment, we will call you to review the results.   Testing/Procedures: NONE   Follow-Up: At [facility], you and your health needs are our priority.  As part of our continuing mission to provide you with exceptional heart care, we have created designated Provider Care Teams.  These Care Teams include your primary Cardiologist (physician) and Advanced Practice Providers (APPs -  Physician Assistants and Nurse Practitioners) who all work together to provide you with the care you need, when you need it.  We recommend signing up for the patient portal called "MyChart".  Sign up information is provided on this After Visit Summary.  MyChart is used to connect with patients for Virtual Visits (Telemedicine).  Patients are able to view lab/test results, encounter notes, upcoming appointments, etc.  Non-urgent messages can be sent to your provider as well.   To learn more about what you can do with MyChart, go to [email or web address].    Your next appointment:   6 month(s)  The format for your next appointment:   In Person  Provider:   Kristeen Miss, MD     Important Information About Sugar         Signed, Kristeen Miss, MD  08/19/2022 10:29 AM     [facility]

## 2022-08-19 NOTE — Patient Instructions (Signed)
Medication Instructions:  Your physician recommends that you continue on your current medications as directed. Please refer to the Current Medication list given to you today.  *If you need a refill on your cardiac medications before your next appointment, please call your pharmacy*   Lab Work: NONE If you have labs (blood work) drawn today and your tests are completely normal, you will receive your results only by: MyChart Message (if you have MyChart) OR A paper copy in the mail If you have any lab test that is abnormal or we need to change your treatment, we will call you to review the results.   Testing/Procedures: NONE   Follow-Up: At [facility], you and your health needs are our priority.  As part of our continuing mission to provide you with exceptional heart care, we have created designated Provider Care Teams.  These Care Teams include your primary Cardiologist (physician) and Advanced Practice Providers (APPs -  Physician Assistants and Nurse Practitioners) who all work together to provide you with the care you need, when you need it.  We recommend signing up for the patient portal called "MyChart".  Sign up information is provided on this After Visit Summary.  MyChart is used to connect with patients for Virtual Visits (Telemedicine).  Patients are able to view lab/test results, encounter notes, upcoming appointments, etc.  Non-urgent messages can be sent to your provider as well.   To learn more about what you can do with MyChart, go to [email or web address].    Your next appointment:   6 month(s)  The format for your next appointment:   In Person  Provider:   Philip Nahser, MD       Important Information About Sugar       

## 2022-08-21 ENCOUNTER — Telehealth: Payer: Self-pay | Admitting: Cardiovascular Disease

## 2022-08-21 NOTE — Telephone Encounter (Signed)
Duwayne Heck is calling requesting the OV notes from patient's 12/12 appt be faxed to [phone]. Please advise.

## 2023-02-17 ENCOUNTER — Encounter: Payer: Self-pay | Admitting: Cardiovascular Disease

## 2023-02-17 NOTE — Progress Notes (Signed)
This encounter was created in error - please disregard.

## 2023-02-18 ENCOUNTER — Ambulatory Visit: Payer: No Typology Code available for payment source | Admitting: Cardiovascular Disease

## 2024-02-03 ENCOUNTER — Other Ambulatory Visit: Payer: Self-pay

## 2024-02-03 ENCOUNTER — Ambulatory Visit
Admission: RE | Admit: 2024-02-03 | Discharge: 2024-02-03 | Disposition: A | Discharge status: Home / Self Care | Source: Ambulatory Visit | Attending: Family Medicine | Admitting: Family Medicine

## 2024-02-03 VITALS — BP 136/82 | HR 91 | Temp 98.8°F | Resp 20

## 2024-02-03 DIAGNOSIS — J209 Acute bronchitis, unspecified: Secondary | ICD-10-CM | POA: Diagnosis not present

## 2024-02-03 MED ORDER — PREDNISONE 20 MG PO TABS: 40.0000 mg | ORAL_TABLET | Freq: Every day | ORAL | 0 refills | Status: AC

## 2024-02-03 MED ORDER — AZITHROMYCIN 250 MG PO TABS: ORAL_TABLET | ORAL | 0 refills | Status: AC

## 2024-02-03 MED ORDER — HYDROCOD POLI-CHLORPHE POLI ER 10-8 MG/5ML PO SUER: 5.0000 mL | Freq: Two times a day (BID) | ORAL | 0 refills | Status: AC | PRN

## 2024-02-03 NOTE — Discharge Instructions (Signed)
 Continue to drink lots of lfuids Stop Augmentin Take the azithromycin  in its place Take the prednisone  once a day for 5 days Watch dietary carbs while on the prednisone  May continue mucinex /tessalon perles during the day Take the tussionex at night.  May take twice a day but caution drowsiness

## 2024-02-03 NOTE — ED Provider Notes (Signed)
 [facility]    CSN: [id number] Arrival date & time: 02/03/24  1821      History   Chief Complaint Chief Complaint  Patient presents with   Cough    HPI Lauren Murphy is a 43 y.o. female.   Patient is here for a cough she has been coughing for almost 2 weeks.  After a week or so of coughing she had a televisit.  They told her that it might be a bronchitis but gave her Augmentin and some cough medicine.  She states that she was still coughing several days later so she called back for another televisit.  At this visit the added albuterol  by nebulization.  She states this has helped somewhat but she still has a constant cough.  The cough is keeping her awake.  She is starting to feel very tired.  No chest pain or shortness of breath.  No sweats chills or fever.    Past Medical History:  Diagnosis Date   Cervical high risk HPV (human papillomavirus) test positive 2014, 2015   Colposcopy 2016 - negative.   CIN I (cervical intraepithelial neoplasia I) 2006, 2008, 2010   colposcopic biopsies - [place]    COVID-19    Diabetes mellitus without complication (HCC)    Febrile seizures (HCC)    as a child--pt.states epileptic seizures   HSV-1 (herpes simplex virus 1) infection     Patient Active Problem List   Diagnosis Date Noted   Chest pain of uncertain etiology 08/19/2022   Pneumonia due to COVID-19 virus 08/11/2019   Hypoxia 08/11/2019   Uncontrolled type 2 diabetes mellitus with hyperglycemia (HCC) 08/11/2019   Cardiomegaly 08/11/2019   Hyperlipidemia 08/11/2019   Transaminitis 08/11/2019   Diabetes mellitus without complication (HCC) 02/09/2019   Chronic pain of right knee 12/18/2018    Past Surgical History:  Procedure Laterality Date   CHOLECYSTECTOMY  2003    OB History     Gravida  2   Para  2   Term  2   Preterm      AB      Living  2      SAB      IAB      Ectopic      Multiple      Live Births               Home  Medications    Prior to Admission medications   Medication Sig Start Date End Date Taking? Authorizing Provider  albuterol  (ACCUNEB ) 1.25 MG/3ML nebulizer solution Take 1 ampule by nebulization every 6 (six) hours as needed for wheezing.   Yes [provider]  azithromycin (ZITHROMAX Z-PAK) 250 MG tablet Take 2 pills right away.  After this take 1 pill a day until gone 02/03/24  Yes Stephany Ehrich, MD  chlorpheniramine-HYDROcodone (TUSSIONEX) 10-8 MG/5ML Take 5 mLs by mouth every 12 (twelve) hours as needed for cough. 02/03/24  Yes Stephany Ehrich, MD  predniSONE  (DELTASONE ) 20 MG tablet Take 2 tablets (40 mg total) by mouth daily with breakfast. 02/03/24  Yes Stephany Ehrich, MD  tirzepatide (MOUNJARO) 5 MG/0.5ML Pen Inject 5 mg into the skin once a week.   Yes [provider]  colesevelam  (WELCHOL ) 625 MG tablet TAKE 2 TABLETS (1,250 MG TOTAL) BY MOUTH DAILY WITH BREAKFAST. 02/18/21   Albertina Hugger, MD  norgestrel -ethinyl estradiol  (CRYSELLE -28) 0.3-30 MG-MCG tablet Take 1 tablet by mouth daily. 02/08/18   [provider]  Family History Family History  Problem Relation Age of Onset   Diabetes Mother    Thyroid disease Mother        hypothyroid   Hypertension Father    Diabetes Sister    Diabetes Maternal Grandmother    Stroke Maternal Grandmother    Hypertension Paternal Grandmother    Stroke Paternal Grandmother    Diabetes Paternal Grandfather    Glaucoma Paternal Grandfather    Diabetes Sister    Colon cancer Neg Hx    Stomach cancer Neg Hx    Pancreatic cancer Neg Hx    Pancreatic disease Neg Hx     Social History Social History   Tobacco Use   Smoking status: Never   Smokeless tobacco: Never  Vaping Use   Vaping status: Never Used  Substance Use Topics   Alcohol use: Not Currently    Comment: rarely   Drug use: Never     Allergies   Atorvastatin   Review of Systems Review of Systems See HPI  Physical Exam Triage  Vital Signs ED Triage Vitals  Encounter Vitals Group     BP 02/03/24 1829 136/82     Systolic BP Percentile --      Diastolic BP Percentile --      Pulse Rate 02/03/24 1829 91     Resp 02/03/24 1829 20     Temp 02/03/24 1829 98.8 F (37.1 C)     Temp src --      SpO2 02/03/24 1829 98 %     Weight --      Height --      Head Circumference --      Peak Flow --      Pain Score 02/03/24 1833 0     Pain Loc --      Pain Education --      Exclude from Growth Chart --    No data found.  Updated Vital Signs BP 136/82   Pulse 91   Temp 98.8 F (37.1 C)   Resp 20   LMP 01/07/2024 (Approximate)   SpO2 98%      Physical Exam Constitutional:      General: She is not in acute distress.    Appearance: She is well-developed.  HENT:     Head: Normocephalic and atraumatic.     Right Ear: Tympanic membrane normal.     Left Ear: Tympanic membrane normal.     Nose: Congestion and rhinorrhea present.     Comments: Nasal membranes are swollen and red.  Yellow discharge noted    Mouth/Throat:     Mouth: Mucous membranes are moist.     Pharynx: Posterior oropharyngeal erythema present.     Comments: Slight erythema of posterior pharynx Eyes:     Conjunctiva/sclera: Conjunctivae normal.     Pupils: Pupils are equal, round, and reactive to light.  Cardiovascular:     Rate and Rhythm: Normal rate and regular rhythm.     Heart sounds: Normal heart sounds.  Pulmonary:     Effort: Pulmonary effort is normal. No respiratory distress.     Breath sounds: Wheezing present.     Comments: Few inspiratory wheeze.  No evidence of rales or pneumonia Musculoskeletal:        General: Normal range of motion.     Cervical back: Normal range of motion.  Lymphadenopathy:     Cervical: No cervical adenopathy.  Skin:    General: Skin is warm and dry.  Neurological:  Mental Status: She is alert.      UC Treatments / Results  Labs (all labs ordered are listed, but only abnormal results are  displayed) Labs Reviewed - No data to display  EKG   Radiology No results found.  Procedures Procedures (including critical care time)  Medications Ordered in UC Medications - No data to display  Initial Impression / Assessment and Plan / UC Course  I have reviewed the triage vital signs and the nursing notes.  Pertinent labs & imaging results that were available during my care of the patient were reviewed by me and considered in my medical decision making (see chart for details).     Explained that this is likely a viral bronchitis.  The cough can last for a couple of weeks.  She may have some bronchial inflammation.  Because she has been sick for 2 weeks I will give her a Z-Pak to cover any atypical bacteria.  Prednisone  for the wheezing.  She is tired from the unremitting coughing and will give her a small prescription for Tussionex to help.  Call for problems Final Clinical Impressions(s) / UC Diagnoses   Final diagnoses:  Acute bronchitis, unspecified organism   Discharge Instructions      Continue to drink lots of lfuids Stop Augmentin Take the azithromycin in its place Take the prednisone  once a day for 5 days Watch dietary carbs while on the prednisone  May continue mucinex /tessalon perles during the day Take the tussionex at night.  May take twice a day but caution drowsiness   ED Prescriptions     Medication Sig Dispense Auth. Provider   predniSONE  (DELTASONE ) 20 MG tablet Take 2 tablets (40 mg total) by mouth daily with breakfast. 10 tablet Stephany Ehrich, MD   azithromycin (ZITHROMAX Z-PAK) 250 MG tablet Take 2 pills right away.  After this take 1 pill a day until gone 6 tablet Stephany Ehrich, MD   chlorpheniramine-HYDROcodone (TUSSIONEX) 10-8 MG/5ML Take 5 mLs by mouth every 12 (twelve) hours as needed for cough. 115 mL Stephany Ehrich, MD      PDMP not reviewed this encounter.   Stephany Ehrich, MD 02/03/24 [phone]

## 2024-02-03 NOTE — ED Triage Notes (Signed)
 For a week and a half has had cough, headaches from coughing so much. No fever. Has had teledoc appointment and was rx amoxicillin (has 4 days left) and tesslon. Has tried mucinex , tussin, nyquil, dayquil.
